# Patient Record
Sex: Female | Born: 2000 | Race: Black or African American | Hispanic: No | Marital: Single | Smoking: Never smoker
Health system: Southern US, Community
[De-identification: ages and names within clinical notes are randomized; demographics above are authoritative.]

## PROBLEM LIST (undated history)

## (undated) DIAGNOSIS — K0889 Other specified disorders of teeth and supporting structures: Secondary | ICD-10-CM

## (undated) DIAGNOSIS — Z9641 Presence of insulin pump (external) (internal): Secondary | ICD-10-CM

## (undated) DIAGNOSIS — E109 Type 1 diabetes mellitus without complications: Secondary | ICD-10-CM

## (undated) DIAGNOSIS — R569 Unspecified convulsions: Secondary | ICD-10-CM

## (undated) DIAGNOSIS — E301 Precocious puberty: Secondary | ICD-10-CM

---
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
## 2022-02-09 ENCOUNTER — Ambulatory Visit (INDEPENDENT_AMBULATORY_CARE_PROVIDER_SITE_OTHER): Payer: Medicaid Other | Admitting: Family

## 2022-02-09 ENCOUNTER — Ambulatory Visit (INDEPENDENT_AMBULATORY_CARE_PROVIDER_SITE_OTHER): Payer: Medicaid Other | Admitting: Pharmacist

## 2022-02-09 ENCOUNTER — Encounter (INDEPENDENT_AMBULATORY_CARE_PROVIDER_SITE_OTHER): Payer: Self-pay | Admitting: Family

## 2022-02-09 ENCOUNTER — Encounter (INDEPENDENT_AMBULATORY_CARE_PROVIDER_SITE_OTHER): Payer: Self-pay | Admitting: Pharmacist

## 2022-02-09 VITALS — BP 108/62 | HR 93 | Wt 123.0 lb

## 2022-02-09 DIAGNOSIS — Z91199 Patient's noncompliance with other medical treatment and regimen due to unspecified reason: Secondary | ICD-10-CM | POA: Diagnosis not present

## 2022-02-09 DIAGNOSIS — Z4681 Encounter for fitting and adjustment of insulin pump: Secondary | ICD-10-CM

## 2022-02-09 DIAGNOSIS — F418 Other specified anxiety disorders: Secondary | ICD-10-CM | POA: Diagnosis not present

## 2022-02-09 DIAGNOSIS — E785 Hyperlipidemia, unspecified: Secondary | ICD-10-CM

## 2022-02-09 DIAGNOSIS — E1065 Type 1 diabetes mellitus with hyperglycemia: Secondary | ICD-10-CM

## 2022-02-09 DIAGNOSIS — E782 Mixed hyperlipidemia: Secondary | ICD-10-CM

## 2022-02-09 LAB — POCT GLYCOSYLATED HEMOGLOBIN (HGB A1C): Hemoglobin A1C: 10.9 % — AB (ref 4.0–5.6)

## 2022-02-09 LAB — POCT GLUCOSE (DEVICE FOR HOME USE): Glucose Fasting, POC: 271 mg/dL — AB (ref 70–99)

## 2022-02-09 MED ORDER — ACCU-CHEK GUIDE W/DEVICE KIT: PACK | 6 refills | Status: DC

## 2022-02-09 MED ORDER — OMNIPOD 5 DEXG7G6 INTRO GEN 5 KIT: 1.0000 | PACK | 2 refills | Status: AC

## 2022-02-09 MED ORDER — ACCU-CHEK SOFTCLIX LANCETS MISC: 11 refills | Status: DC

## 2022-02-09 MED ORDER — ACCU-CHEK GUIDE VI STRP: ORAL_STRIP | 11 refills | Status: DC

## 2022-02-09 NOTE — Progress Notes (Signed)
Subjective:  Chief Complaint  Patient presents with   Diabetes    Omnipod 5 Pump Training     Endocrinology provider: Hermenia Bers, NP (joint appt)  Patient referred to me by Hermenia Bers, NP for Omnipod 5 pump training. PMH significant for T1DM, chronic migraine, dysmenorrhea, binocular vision disorder. Patient is currently using Dexcom G6 CGM and Omnipod Eros insulin pump.  Patient presents today independently. Patient brings Omnipod 5 G6 pods (1 pack of 5 pods) previously filled 12/14/2020. She does not have Omnipod 5 Intro Kit, specifically controller. Verlean will no longer have Medicaid for 2024 and will be transitioning to her mother insurance.   Insurance: [place] Managed [facility])  [id number] [place] [facility] [address] [place]Phone: [phone] Fax: [phone] #: [id number] DAW Reason: --   Omnipod Original Pump Settings  Basal (Max: 1.5 units/hr) 12AM 0.65  6AM 0.95  12PM 0.90  8PM 1.05            Total: 21 units  Insulin to carbohydrate ratio (ICR)  12AM 10  6AM 6 --> 8                  Max Bolus: 15 units  Insulin Sensitivity Factor (ISF) 12AM 50  6AM 40  9PM 45                Target BG 12AM 150  6AM 110  9PM 150                 Omnipod Education Training Please refer to ODalzellscanned into media  Glooko Account: [email or web address]  Podder Account:  --[email or web address]    Objective:   There were no vitals filed for this visit.  HbA1c Lab Results  Component Value Date   HGBA1C 10.9 (A) 02/09/2022   HGBA1C 10.4 (A) 09/29/2021   HGBA1C 11.5 (H) 07/19/2021    Pancreatic Islet Cell Autoantibodies Lab Results  Component Value Date   ISLETAB 40 (A) 12/08/2008    Insulin Autoantibodies Lab Results  Component Value Date   INSULINAB 9.1 (H) 12/08/2008     Glutamic Acid Decarboxylase Autoantibodies Lab Results  Component Value Date   GLUTAMICACAB 8.3 (H) 12/08/2008    ZnT8 Autoantibodies No results found for: "ZNT8AB"  IA-2 Autoantibodies No results found for: "LABIA2"  C-Peptide Lab Results  Component Value Date   CPEPTIDE 0.54 (L) 12/08/2008    Microalbumin Lab Results  Component Value Date   MICRALBCREAT NOTE 07/19/2021    Lipids    Component Value Date/Time   CHOL 232 (H) 07/19/2021 1351   TRIG 82 07/19/2021 1351   HDL 66 07/19/2021 1351   CHOLHDL 3.5 07/19/2021 1351   VLDL 20 01/28/2014 1100   LDLCALC 147 (H) 07/19/2021 1351    Assessment and Plan: Pump Settings - per SHermenia Bers NP (appreciate assistance and expertise). I will assist patient with entering settings into Omnipod 5 controller on 02/14/22 4:00 pm.  Pump Education - Parents appeared to have sufficient understanding of subjects discussed during OTurrellTraining appt.  Pharmacy Issues - Patient brings Omnipod 5 G6 pods (1 pack of 5 pods) previously filled 12/14/2020. She does not have Omnipod 5 Intro Kit, specifically controller. [facility] [address] [place] [facility] that this prescription was filled for G6 pods rather than intro kit (misfill) but unfortunately cannot  reverse insurance claim considering prescription was filled >1 year ago. Provided verbal prescription for intro kit and asked her if insurance would be able to be processed to determine if covered. Pharmacist confirmed $0 copay, however, intro kit is not in stock. She will order the intro kit and should be in stock tomorrow. I also sent in prescription for Accu Chek meter and supplies as new Omnipod 5 controller does not have capability to check blood glucose.  Insurance Issues - Kahmya will no longer have Medicaid for 2024 and will be transitioning to her mother's insurance. Mother is obtaining new insurance for 2024 and does not currently have  the card. She would like to know the copay of Omnipod 5 moving forward to determine if upgrading to the Omnipod 5 will be feasible from a financial standpoint. Advised [place] to send me a picture of pharmacy insurance card (should contain RxBIN, RxPCN, RxGroup, ID number) as soon as it arrives and I am happy to route card to our certified pharmacy technician team to determine copay.   Emailed Omnipod 5 Resource guide to [email or web address]  This appointment required 120 minutes of patient care (this includes precharting, chart review, review of results, face-to-face care, etc.).  Thank you for involving clinical pharmacist/diabetes educator to assist in providing this patient's care.  Drexel Iha, PharmD, BCACP, [place], CPP

## 2022-02-09 NOTE — Progress Notes (Signed)
Pediatric Endocrinology Diabetes Consultation Follow-up Visit  STEPHAN DRAUGHN 03-28-2000 [id number]  Chief Complaint: Follow-up type 1 diabetes   Pcp, No   HPI: Phyllis [name]  is a 21 y.o. female presenting for follow-up of type 1 diabetes. she is accompanied to this visit by her mother.  1. Eula was diagnosed with new-onset type 1 diabetes mellitus on 12/08/2008.  She was started on our usual multiple daily injection of insulin regimen with Lantus as a basal insulin and Novolog as her rapid-acting insulin. She was later converted to a Medtronic Revel insulin pump. They have also had ongoing concerns about early puberty. 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 However, since spring of 2013 mom has felt that she is progressing faster into puberty. She had a Supprelin implant placed on 03/01/12. However, she never had good suppression with the implant and it was removed 08/16/2012   2. Since her last appointment on 09/2021 she has been well.   She will changing insurance to [employer] and dropping medicaid due to her age. Concerned about price of Omnipod 5 supplies. Has back up supply of Omnipod Eros so will use those until she is out.   She went about 1 month between October and November not wearing CGM due to issues with insurance.  Did not do fingerstick blood sugars when she was out of She reports "doing better" with bolusing other then in October. She states she is trying to be more consistent with diabetes care. Reports hypoglycemia is occurring when she use square wave boluses. High blood sugars when she forgets to bolus.    Insulin regimen:   Basal Rates 12AM 0,65  6am 0.95   12pm  0.90   8pm 1.05      21 units per day   Insulin to Carbohydrate Ratio 12AM 10  6am 6             Insulin Sensitivity Factor 12AM 50   6am 40  9pm 45         Target Blood Glucose 12AM 150  6am 110  9pm 150          Hypoglycemia: She is sable to feel low  blood sugars.  No glucagon needed recently.  Insulin Pump download:     - CGM and Omnipod show patterns of low blood sugars following boluses at meals.    Med-alert ID: Not currently wearing. Injection sites: arms and legs  Annual labs due: 06/2022 Ophthalmology due: Overdue. Stressed importance     3. ROS: Greater than 10 systems reviewed with pertinent positives listed in HPI, otherwise neg. Review of Systems  Constitutional: Negative for malaise/fatigue.  HENT: Negative.   Eyes: Negative for blurred vision, photophobia and pain.  Respiratory: Negative for cough and shortness of breath.   Cardiovascular: Negative for chest pain and palpitations.  Gastrointestinal: Negative for abdominal pain, constipation and diarrhea.  Genitourinary: Negative for frequency and urgency.  Musculoskeletal: Negative for neck pain.  Skin: Negative for itching and rash.  Neurological: Negative for dizziness, tingling, tremors, sensory change, seizures, weakness and headaches.  Endo/Heme/Allergies: Negative for polydipsia.  Psychiatric/Behavioral: Denies depression and anxiety  Denies SI.            Past Medical History:   Past Medical History:  Diagnosis Date   Diabetes mellitus type 1 (Jack)    Insulin pump; history of DKA 01/19/2012   Insulin pump in place    Precocious puberty 12/2011   Seizures [facility]) age  8   x 1 - due to low blood sugar   Tooth loose 02/27/2012   x 1 - upper    Medications:  Outpatient Encounter Medications as of 02/09/2022  Medication Sig   Continuous Blood Gluc Sensor (DEXCOM G6 SENSOR) MISC 1 Units by Does not apply route as needed.   Continuous Blood Gluc Transmit (DEXCOM G6 TRANSMITTER) MISC 1 Units by Does not apply route as needed.   insulin lispro (HUMALOG) 100 UNIT/ML injection INJECT 300 UNITS WITH INSULIN PUMP EVERY 48 HOURS.   calcium carbonate (TUMS - DOSED IN MG ELEMENTAL CALCIUM) 500 MG chewable tablet Chew 1 tablet (200 mg of elemental calcium total)  by mouth 2 (two) times daily as needed for indigestion or heartburn. (Patient not taking: Reported on 12/10/2020)   famotidine (PEPCID) 20 MG tablet TAKE 1 TABLET (20 MG TOTAL) BY MOUTH DAILY. (Patient not taking: Reported on 12/10/2020)   Glucagon (BAQSIMI TWO PACK) 3 MG/DOSE POWD Place 1 Units into the nose as needed. (Patient not taking: Reported on 12/10/2020)   glucagon (GLUCAGON EMERGENCY) 1 MG injection Inject 1 mg IM for severe hypoglycemia. (Patient not taking: Reported on 03/24/2020)   ibuprofen (ADVIL,MOTRIN) 200 MG tablet Take 400 mg by mouth every 6 (six) hours as needed for moderate pain. (Patient not taking: Reported on 12/10/2020)   Insulin Disposable Pump (OMNIPOD 5 G6 INTRO, GEN 5,) KIT Inject 1 Device into the skin as directed. Change pod every 2 days. This will be a 30 day supply. Please fill for [facility] [id number] (Patient not taking: Reported on 02/11/2021)   Lancets (ACCU-CHEK MULTICLIX) lancets USE AS DIRECTED TO TEST BLOOD GLUCOSE 6 TIMES DAILY (Patient not taking: Reported on 07/19/2021)   lidocaine-prilocaine (EMLA) cream APPLY TO PUMP SITE CHANGES (Patient not taking: Reported on 09/29/2021)   NOVOLOG FLEXPEN 100 UNIT/ML FlexPen Inject up to 50 units of Novolog aspart insulin as needed. (Patient taking differently: Inject 50 Units into the skin as needed for high blood sugar (for emergencies only).Marilu Favre 150-35 MCG/24HR transdermal patch Place onto the skin See admin instructions. wear one patch every 7 days for 3 weeks, and then the fourth week is patch-free. (Patient not taking: Reported on 05/25/2020)   No facility-administered encounter medications on file as of 02/09/2022.    Allergies: No Known Allergies  Surgical History: Past Surgical History:  Procedure Laterality Date   SUPPRELIN IMPLANT  03/01/2012   Procedure: SUPPRELIN IMPLANT;  Surgeon: Jerilynn Mages. Gerald Stabs, MD;  Location: [place];  Service: Pediatrics;  Laterality: Right;  RIGHT ARM     SUPPRELIN REMOVAL Right 08/16/2012   Procedure: SUPPRELIN REMOVAL;  Surgeon: Jerilynn Mages. Gerald Stabs, MD;  Location: [place];  Service: Pediatrics;  Laterality: Right;    Family History:  Family History  Problem Relation Age of Onset   Hypertension Mother    Hypertension Maternal Grandfather    Migraines Maternal Grandmother    Depression Maternal Grandmother    Anxiety disorder Maternal Grandmother    Depression Maternal Aunt       Social History: Lives with: Mother and sister  Working at [employer]   Physical Exam:  Vitals:   02/09/22 0837  BP: 108/62  Pulse: 93  Weight: 123 lb (55.8 kg)       BP 108/62   Pulse 93   Wt 123 lb (55.8 kg)   BMI 23.40 kg/m  Body mass index: body mass index is 23.4 kg/m. Blood pressure %iles are not available for  patients who are 18 years or older.  Ht Readings from Last 3 Encounters:  12/10/20 5' 0.79" (1.544 m) (9 %, Z= -1.36)*  05/25/20 5' 0.63" (1.54 m) (8 %, Z= -1.41)*  03/23/20 5' 1.3" (1.557 m) (13 %, Z= -1.14)*   * Growth percentiles are based on CDC (Girls, 2-20 Years) data.   Wt Readings from Last 3 Encounters:  02/09/22 123 lb (55.8 kg) (40 %, Z= -0.24)*  09/29/21 118 lb 3.2 oz (53.6 kg) (32 %, Z= -0.47)*  07/19/21 123 lb 12.8 oz (56.2 kg) (44 %, Z= -0.14)*   * Growth percentiles are based on CDC (Girls, 2-20 Years) data.   PHYSICAL EXAM:  General: Well developed, well nourished female in no acute distress.   Head: Normocephalic, atraumatic.   Eyes:  Pupils equal and round. EOMI.   Sclera white.  No eye drainage.   Ears/Nose/Mouth/Throat: Nares patent, no nasal drainage.  Normal dentition, mucous membranes moist.   Neck: supple, no cervical lymphadenopathy, no thyromegaly Cardiovascular: regular rate, normal S1/S2, no murmurs Respiratory: No increased work of breathing.  Lungs clear to auscultation bilaterally.  No wheezes. Abdomen: soft, nontender, nondistended. No appreciable masses  Extremities:  warm, well perfused, cap refill < 2 sec.   Musculoskeletal: Normal muscle mass.  Normal strength Skin: warm, dry.  No rash or lesions. Neurologic: alert and oriented, normal speech, no tremor   Labs:   Results for orders placed or performed in visit on 02/09/22  POCT glycosylated hemoglobin (Hb A1C)  Result Value Ref Range   Hemoglobin A1C 10.9 (A) 4.0 - 5.6 %   HbA1c POC (<> result, manual entry)     HbA1c, POC (prediabetic range)     HbA1c, POC (controlled diabetic range)    POCT Glucose (Device for Home Use)  Result Value Ref Range   Glucose Fasting, POC 271 (A) 70 - 99 mg/dL   POC Glucose    '   Assessment/Plan: Samanthamarie is a 21 y.o. female with uncontrolled type 1 diabetes on Omnipod insulin pump. Keyarah has a pattern of hypoglycemia post prandially, need to reduce carb ratio. Hemoglobin A1c 10.9% which is higher then ADA goal of <7%. She would benefit from changing to Omnipod 5 closed loop system as we have discussed at multiple appointments.     1. DM w/o complication type I, uncontrolled (HCC)/ Hyperglycemia - Reviewed insulin pump and CGM download. Discussed trends and patterns.  - Rotate pump sites to prevent scar tissue.  - bolus 15 minutes prior to eating to limit blood sugar spikes.  - Reviewed carb counting and importance of accurate carb counting.  - Discussed signs and symptoms of hypoglycemia. Always have glucose available.  - POCT glucose and hemoglobin A1c  - Reviewed growth chart.  - Discussed transition to Omnipod 5.  - Stressed importance of closely monitoring blood glucose levels even when not wearing Dexcom CGM.   2. Anxiety/depression 3. Noncompliance  - Stressed importance of good diabetes care to prevent diabetes related complicated.  - Discussed barriers to care.   3. Insulin pump titration  Insulin to Carbohydrate Ratio 12AM 10  6am 6--> 8              4. Hyperlipidemia.  - Encouraged improving diabetes care along with healthy diet.     Follow-up: 3 months.   LOS: >40  spent today reviewing the medical chart, counseling the patient/family, and documenting today's visit. When a patient is on insulin, intensive monitoring of blood glucose levels is  necessary to avoid hyperglycemia and hypoglycemia. Severe hyperglycemia/hypoglycemia can lead to hospital admissions and be life threatening.     Hermenia Bers,  FNP-C  Pediatric Specialist  [address] [place]  [place], [address]  Tele: [phone]

## 2022-02-09 NOTE — Patient Instructions (Addendum)
It was a pleasure seeing you in clinic today. Please do not hesitate to contact me if you have questions or concerns.   Please sign up for MyChart. This is a communication tool that allows you to send an email directly to me. This can be used for questions, prescriptions and blood sugar reports. We will also release labs to you with instructions on MyChart. Please do not use MyChart if you need immediate or emergency assistance. Ask our wonderful front office staff if you need assistance.    Insulin to Carbohydrate Ratio 12AM 10  6am 6--> 8

## 2022-02-09 NOTE — Patient Instructions (Signed)
It was a pleasure seeing you today!  If your pump breaks, your Lantus/Basaglar/Semglee dose would be 21 units daily. You would do the following equation for your Novolog or Humalog:  Novolog or Humalog total dose = food dose + correction dose Food dose: total carbohydrates divided by insulin carbohydrate ratio (ICR) Insulin carbohydrate ratio (ICR) Breakfast: 8 Lunch: 8 Dinner: 8 Bedtime: 10 Correction dose: (current blood sugar - target blood sugar) divided by insulin sensitivity factor (ISF, also known as correction factor)  Insulin sensitivity factor (ISF) Breakfast: 40 Lunch: 40 Dinner: 40 Bedtime: 50 Target blood sugar Breakfast: 110 Lunch: 110 Dinner: 110 Bedtime: 150  PLEASE REMEMBER TO CONTACT OFFICE IF YOU ARE AT RISK OF RUNNING OUT OF PUMP SUPPLIES, INSULIN PEN SUPPLIES, OR IF YOU WANT TO KNOW WHAT YOUR BACK UP INSULIN PEN DOSES ARE.   To summarize our visit, these are the major updates with Omnipod 5:  Automated vs limited vs manual mode Automated mode: this is when the "smart" pump is turned on and pump will adjust insulin based on Dexcom readings predicted 60 minutes into the future Limited mode: when pump is trying to connect to automated mode, however, there may be issues. For example, when new Dexcom sensor is applied there is a 2 hour warm up period (no CGM readings). Manual mode: this is when the "smart" pump is NOT turned on and pump goes back to settings put in by provider (kind of like going back to [employer]) You can switch modes by going to settings --> mode --> switch from automated to manual mode or vice versa Why would I switch from automated mode to manual mode? 1. To put in new Dexcom transmitter code (reminder you must do this every 90 days AFTER you update it in Dexcom app) To do this you will change to manual mode --> settings --> CGM transmitter --> enter new code 2. If you get put on steroid medications (e.g., prednisone, methylprednisolone) 3.  If you try activity mode and still experience low blood sugars then you can go to manual mode to turn on a temporary basal rate (decrease 100% in 30 min incrememnts) KEEP IN MIND LINE OF SIGHT WITH DEXCOM! Dexcom and pod must be on the same side of the body. They can be across from each other on the abdomen or lower back/upper buttocks (refer to pages 20 and 21 in resource guide) Make sure to press use CGM rather than type in blood sugar when blousing. When you press use CGM it takes in consideration the Dexcom reading AND arrow.  Omnipod 5 pods will have a clear tab and have Omnipod 5 written on pod compared to Dash pods (blue tab). Omnipod Dash and Omnipod 5 pods cannot be interchangeable. You must solely use Omnipod 5 pods when using Omnipod 5 PDM/app.  If your Omnipod is having issues with receiving Dexcom readings make sure to move the PDM/cellphone closer to the POD (NOT the Dexcom) (refer to page 9 of resource guide to review system communication)  Please contact me (Dr. Ladona Ridgel) at [phone] or via Mychart with any questions/concerns

## 2022-02-10 ENCOUNTER — Other Ambulatory Visit (INDEPENDENT_AMBULATORY_CARE_PROVIDER_SITE_OTHER): Payer: Self-pay | Admitting: Family

## 2022-02-14 ENCOUNTER — Telehealth (INDEPENDENT_AMBULATORY_CARE_PROVIDER_SITE_OTHER): Payer: Self-pay | Admitting: Pharmacist

## 2022-02-14 ENCOUNTER — Encounter (INDEPENDENT_AMBULATORY_CARE_PROVIDER_SITE_OTHER): Payer: Self-pay | Admitting: Pharmacist

## 2022-02-14 DIAGNOSIS — E1065 Type 1 diabetes mellitus with hyperglycemia: Secondary | ICD-10-CM

## 2022-02-14 MED ORDER — OMNIPOD 5 DEXG7G6 PODS GEN 5 MISC: 1.0000 | 4 refills | Status: DC

## 2022-02-14 MED ORDER — OMNIPOD 5 DEXG7G6 INTRO GEN 5 KIT: 1.0000 | PACK | 2 refills | Status: DC

## 2022-02-14 NOTE — Telephone Encounter (Signed)
Called patient on 02/14/2022 at 4:46 PM   Explained to her I had called her pharmacy - apparently Omnnipod 5 intro kit is still being processed in order (has not came yet). I have contacted my Omnipod rep, Gabe, who confirmed it is not on backorder.  Will try sending prescription to [facility] as they have a partnership with Omnipod to see if they can fill prescription. Will send [facility] contact info to [place] via Mychart and advise them to follow up with [facility] pharmacy tomorrow then contact me with response to further assist and/or reschedule training appt.  Thank you for involving clinical pharmacist/diabetes educator to assist in providing this patient's care.   Mary Taylor, PharmD, BCACP, CDCES, CPP;e 

## 2022-02-16 ENCOUNTER — Encounter (INDEPENDENT_AMBULATORY_CARE_PROVIDER_SITE_OTHER): Payer: Self-pay | Admitting: Pharmacist

## 2022-02-16 NOTE — Telephone Encounter (Signed)
Please follow up with [facility] pharmacy to ask  If they were able to fill Phyllis [name]'s Omnipod 5 Intro kit prescription Are they having any issues ordering Omnipod 5 intro kit or refill pods  Thank you for involving clinical pharmacist/diabetes educator to assist in providing this patient's care.   Drexel Iha, PharmD, BCACP, [place], CPP

## 2022-02-16 NOTE — Telephone Encounter (Signed)
Called [facility] to follow up:  Pre the rep:  1) [facility] [address] sent to the pharmacy on 02/16/22 2) [facility] an issue but the local pharmacies as well as places like [facility] have had issues getting them in stock to fill.   Per rep they follow up with the pharmacies 1 day after sending the script.  They speak with [facility] and [facility] almost daily and if they don't have it in stock they can typically get it in within a day or 2.

## 2022-03-02 ENCOUNTER — Telehealth (INDEPENDENT_AMBULATORY_CARE_PROVIDER_SITE_OTHER): Payer: Self-pay | Admitting: Pharmacist

## 2022-03-03 ENCOUNTER — Telehealth (INDEPENDENT_AMBULATORY_CARE_PROVIDER_SITE_OTHER): Payer: Medicaid Other | Admitting: Pharmacist

## 2022-03-03 ENCOUNTER — Encounter (INDEPENDENT_AMBULATORY_CARE_PROVIDER_SITE_OTHER): Payer: Self-pay | Admitting: Pharmacist

## 2022-03-03 DIAGNOSIS — E1065 Type 1 diabetes mellitus with hyperglycemia: Secondary | ICD-10-CM | POA: Diagnosis not present

## 2022-03-03 DIAGNOSIS — Z9641 Presence of insulin pump (external) (internal): Secondary | ICD-10-CM | POA: Diagnosis not present

## 2022-03-03 NOTE — Progress Notes (Signed)
This is a Pediatric Specialist E-Visit (My Chart Video Visit) follow up consult provided via [place] consented to an E-Visit consult today.  Location of patient: Phyllis [name] is at home  Location of provider: Drexel Iha, PharmD, BCACP, CDCES, CPP is at office.    S:     Chief Complaint  Patient presents with   Diabetes    Pump Settings    Endocrinology provider: Hermenia Bers, NP (no upcoming appt)  Patient referred to me by Hermenia Bers, NP for Omnipod 5 pump training. PMH significant for T1DM, chronic migraine, dysmenorrhea, binocular vision disorder. Patient is currently using Dexcom G6 CGM and Omnipod Eros insulin pump.  I connected with Phyllis [name] on 03/03/2022 by video and verified that I am speaking with the correct person using two identifiers. She is still awaiting new insurance card to determine Dexcom and Omnipod 5 pump coverage. She does not have any Dexcom G6 sensors and is awaiting insurance coverage before getting more. She wants to wait to start Omnipod 5 until she can determine coverage and attain additional Dexcom G6 sensors.   Insurance: [place] Managed [facility])   [id number] [place] [place] [place] [address], [place] [address] Phone: [phone]  Fax: [phone] DEA #: [id number]  DAW Reason: --    Omnipod Original Pump Settings   Basal (Max: 1.5 units/hr) 12AM 0.65  6AM 0.95  12PM 0.90  8PM 1.05            Total: 21 units   Insulin to carbohydrate ratio (ICR)  12AM 10  6AM 6 --> 8                      Max Bolus: 15 units   Insulin Sensitivity Factor (ISF) 12AM 50  6AM 40  9PM 45                   Target BG 12AM 150  6AM 110  9PM 150                     O:   Labs:   There were no vitals filed for this visit.  HbA1c Lab Results  Component Value Date   HGBA1C 10.9 (A) 02/09/2022   HGBA1C 10.4 (A) 09/29/2021    HGBA1C 11.5 (H) 07/19/2021    Pancreatic Islet Cell Autoantibodies Lab Results  Component Value Date   ISLETAB 40 (A) 12/08/2008    Insulin Autoantibodies Lab Results  Component Value Date   INSULINAB 9.1 (H) 12/08/2008    Glutamic Acid Decarboxylase Autoantibodies Lab Results  Component Value Date   GLUTAMICACAB 8.3 (H) 12/08/2008    ZnT8 Autoantibodies No results found for: "ZNT8AB"  IA-2 Autoantibodies No results found for: "LABIA2"  C-Peptide Lab Results  Component Value Date   CPEPTIDE 0.54 (L) 12/08/2008    Microalbumin Lab Results  Component Value Date   MICRALBCREAT NOTE 07/19/2021    Lipids    Component Value Date/Time   CHOL 232 (H) 07/19/2021 1351   TRIG 82 07/19/2021 1351   HDL 66 07/19/2021 1351   CHOLHDL 3.5 07/19/2021 1351   VLDL 20 01/28/2014 1100   LDLCALC 147 (H) 07/19/2021 1351    Assessment:  Provided pump settings. Patient's pump had to upgrade while on the phone. Patient will await new insurance card information for pharmacy benefits and will send this information as  soon as possible to assess cost of Omnipod 5 and Dexcom G6 CGM. Belem Hintze she could tell her mother to contact her HR department to follow up regarding pharmacy benefits information.  Omnipod 5 Pump Settings   Basal (Max: 1.5 units/hr) 12AM 0.65  6AM 0.95  12PM 0.90  8PM 1.05            Total: 21 units   Insulin to carbohydrate ratio (ICR)  12AM 10  6AM 8                      Max Bolus: 15 units   Insulin Sensitivity Factor (ISF) 12AM 50  6AM 40  9PM 45                   Target and Correct Above BG 12AM 150 / 150  9AM 130 / 130                      Active Insulin Time: 3 hours  Reverse Correction: OFF  This appointment required 25 minutes of patient care (this includes precharting, chart review, review of results, virtual care, etc.).  Thank you for involving clinical pharmacist/diabetes educator to assist in providing this  patient's care.  Drexel Iha, PharmD, BCACP, [place], CPP

## 2022-03-07 ENCOUNTER — Other Ambulatory Visit (INDEPENDENT_AMBULATORY_CARE_PROVIDER_SITE_OTHER): Payer: Self-pay | Admitting: Pharmacist

## 2022-03-07 DIAGNOSIS — E1065 Type 1 diabetes mellitus with hyperglycemia: Secondary | ICD-10-CM

## 2022-03-07 MED ORDER — DEXCOM G6 SENSOR MISC: 1.0000 | 5 refills | Status: DC

## 2022-03-07 MED ORDER — DEXCOM G6 TRANSMITTER MISC: 1.0000 | 3 refills | Status: DC

## 2022-03-07 MED ORDER — OMNIPOD 5 DEXG7G6 PODS GEN 5 MISC: 1.0000 | 4 refills | Status: DC

## 2022-03-24 ENCOUNTER — Encounter (INDEPENDENT_AMBULATORY_CARE_PROVIDER_SITE_OTHER): Payer: Self-pay | Admitting: Family

## 2022-03-24 ENCOUNTER — Telehealth (INDEPENDENT_AMBULATORY_CARE_PROVIDER_SITE_OTHER): Payer: Medicaid Other | Admitting: Pharmacist

## 2022-03-24 ENCOUNTER — Telehealth (INDEPENDENT_AMBULATORY_CARE_PROVIDER_SITE_OTHER): Payer: Self-pay | Admitting: Pharmacist

## 2022-03-24 ENCOUNTER — Other Ambulatory Visit (INDEPENDENT_AMBULATORY_CARE_PROVIDER_SITE_OTHER): Payer: Self-pay | Admitting: Family

## 2022-03-24 ENCOUNTER — Encounter (INDEPENDENT_AMBULATORY_CARE_PROVIDER_SITE_OTHER): Payer: Self-pay | Admitting: Pharmacist

## 2022-03-24 DIAGNOSIS — Z9641 Presence of insulin pump (external) (internal): Secondary | ICD-10-CM

## 2022-03-24 DIAGNOSIS — E1065 Type 1 diabetes mellitus with hyperglycemia: Secondary | ICD-10-CM

## 2022-03-24 MED ORDER — LANTUS SOLOSTAR 100 UNIT/ML ~~LOC~~ SOPN: PEN_INJECTOR | SUBCUTANEOUS | 4 refills | Status: DC

## 2022-03-24 MED ORDER — BD PEN NEEDLE NANO U/F 32G X 4 MM MISC: 4 refills | Status: DC

## 2022-03-24 NOTE — Telephone Encounter (Signed)
  Name of who is calling: Phyllis [name]  Best contact number: [phone]  Provider they see: Drexel Iha   Reason for call: Patient is calling stating her omnipod dash shut down and she called support and they said in the system they have her in with the omnipod 5 so she can't use the dash any more. She dont have any needles either to give herself insulin so needs to set the omnipod 5 up.

## 2022-03-24 NOTE — Progress Notes (Signed)
This is a Pediatric Specialist E-Visit (My Chart Video Visit) follow up consult provided via [place] consented to an E-Visit consult today.  Location of patient: Phyllis [name] is at home  Location of provider: Drexel Iha, PharmD, BCACP, CDCES, CPP is at office.    S:     Chief Complaint  Patient presents with   Diabetes    Endocrinology provider: Hermenia Bers, NP (no upcoming appt)  Patient referred to me by Hermenia Bers, NP for Omnipod 5 pump training. PMH significant for T1DM, chronic migraine, dysmenorrhea, binocular vision disorder. Patient is currently using Dexcom G6 CGM and Omnipod Eros insulin pump.  I connected with Phyllis [name] on 03/24/2022 by video and verified that I am speaking with the correct person using two identifiers. Phyllis [name] panicked as today her Omnipod Eros pump malfunctioned. She showed it to me via the video appointment and the screen was changing colors rapidly. She has her Omnipod 5 controller and Omnipod 5 pods. She was scared to start the Omnipod 5 without speaking with me as she wanted a refresher from our prior training appointment before starting. She started the Omnipod 5 pod, but wanted to understand how to bolus as well as a refresher on automated mode. She also connected her Dexcom G6 transmitter to the Omnipod 5 controller. She thinks right now she still has Medicaid. She does not have back up insulin pens.  Insurance: [place] [employer] [place] [facility])   Pharmacy  [id number] - [place], [place] AT [place] [address] [place] [address] Phone: [phone]  Fax: [phone] DEA #: [id number]  DAW Reason: --    Omnipod 5 Pump Settings   Basal (Max: 1.5 units/hr) 12AM 0.65  6AM 0.95  12PM 0.90  8PM 1.05            Total: 21 units   Insulin to carbohydrate ratio (ICR)  12AM 10  6AM 8                      Max Bolus: 15 units   Insulin Sensitivity Factor  (ISF) 12AM 50  6AM 40  9PM 45                   Target and Correct Above BG 12AM 150 / 150  9AM 130 / 130                      Active Insulin Time: 3 hours  Reverse Correction: OFF   O:   Labs:   There were no vitals filed for this visit.  HbA1c Lab Results  Component Value Date   HGBA1C 10.9 (A) 02/09/2022   HGBA1C 10.4 (A) 09/29/2021   HGBA1C 11.5 (H) 07/19/2021    Pancreatic Islet Cell Autoantibodies Lab Results  Component Value Date   ISLETAB 40 (A) 12/08/2008    Insulin Autoantibodies Lab Results  Component Value Date   INSULINAB 9.1 (H) 12/08/2008    Glutamic Acid Decarboxylase Autoantibodies Lab Results  Component Value Date   GLUTAMICACAB 8.3 (H) 12/08/2008    ZnT8 Autoantibodies No results found for: "ZNT8AB"  IA-2 Autoantibodies No results found for: "LABIA2"  C-Peptide Lab Results  Component Value Date   CPEPTIDE 0.54 (L) 12/08/2008    Microalbumin Lab Results  Component Value Date   MICRALBCREAT NOTE 07/19/2021    Lipids    Component Value Date/Time   CHOL 232 (H)  07/19/2021 1351   TRIG 82 07/19/2021 1351   HDL 66 07/19/2021 1351   CHOLHDL 3.5 07/19/2021 1351   VLDL 20 01/28/2014 1100   LDLCALC 147 (H) 07/19/2021 1351    Assessment:  Omnipod 5 controller has previously been programmed 03/03/22. Reviewed bolusing, automated mode vs manual mode, how to connect Dexcom G6 to Omnipod controller, changing pod (on time and if it falls off early), activity mode, and pausing insulin. She was able to follow along on her controller. She verbalized understanding. She appreciated back up insulin pens have been sent in in case of pump failure. She will contact me if she has any other questions or issues arise.  This appointment required 25 minutes of patient care (this includes precharting, chart review, review of results, virtual care, etc.).  Thank you for involving clinical pharmacist/diabetes educator to assist in providing this  patient's care.  Drexel Iha, PharmD, BCACP, [place], CPP

## 2022-03-24 NOTE — Telephone Encounter (Signed)
Spoke with patient, she stated she needed to know what to do.  She does not have the correct meter with her but has the 5 information in her phone.  She called Omnipod and they couldn't help her with the Deliah Boston as they have her listed using an Omnipod 5.  She does not have back up supplies, no pens, no needles.  She does not know what her long acting is, she thinks she uses Humalog in her pump.  There is no long acting listed in her med management to refill, nor pen needles.   She does have a novolog flexpen listed.  She stated she needs to see Dr. Lovena Le immediately to get her pump on.  I told her that Dr. Lovena Le is remote except 1 day per week.  I will message her and Spenser to get refills for her pens and a back up plan until she can get her pump started.  She asked to speak with Spenser. I told her that he is seeing patients.  We will get the back up plan, supplies sent to the pharmacy, find out more information about where we are in getting her started on the 5 and give her a call back.  She stated ok.

## 2022-03-24 NOTE — Progress Notes (Signed)
DIABETES PLAN  Rapid Acting Insulin (Novolog/FiASP (Aspart) and Humalog/Lyumjev (Lispro))  **Given for Food/Carbohydrates and High Sugar/Glucose**   DAYTIME (breakfast, lunch, dinner) Target Blood Glucose 120 mg/dL Insulin Sensitivity Factor 40 Insulin to Carb Ratio  1 unit for 6  grams   Correction DOSE Food DOSE  (Glucose -Target)/Insulin Sensitivity Factor  Glucose (mg/dL) Units of Rapid Acting Insulin  Less than 120 0  121-160 1  161-200 2  201-240 3  241-280 4  281-320 5  321-360 6  361-400 7  401-440 8  441-480 9  481-520 10  521-560 11  561-600 or more 12    Number of carbohydrates divided by carb ratio  Number of Carbs Units of Rapid Acting Insulin  0-5 0  6-11 1  12-17 2  18-23 3  24-29 4  30-35 5  36-41 6  42-47 7  48-53 8  54-59 9  60-65 10  66-71 11  72-77 12  78-83 13  84-89 14  90-95 15  96-101 16  102-107 17  108-113 18  114-119 19  120-125 20  126-131 21  132-137 22  138-143 23  144-149 24  150-155 25  156-161 26  162+  (# carbs divided by 6)                  **Correction Dose + Food Dose = Number of units of rapid acting insulin **  Correction for High Sugar/Glucose Food/Carbohydrate  Measure Blood Glucose BEFORE you eat. (Fingerstick with Glucose Meter or check the reading on your Continuous Glucose Meter).  Use the table above or calculate the dose using the formula.  Add this dose to the Food/Carbohydrate dose if eating a meal.  Correction should not be given sooner than every 3 hours since the last dose of rapid acting insulin. 1. Count the number of carbohydrates you will be eating.  2. Use the table above or calculate the dose using the formula.  3. Add this dose to the Correction dose if glucose is above target.         BEDTIME Target Blood Glucose 200 mg/dL Insulin Sensitivity Factor 40  Insulin to Carb Ratio  1 unit for 10  grams   Wait at least 3 hours after taking dinner dose of insulin BEFORE checking  bedtime glucose.   Blood Sugar Less Than  100 mg/dL? Blood Sugar Between 101 - 199mg /dL? Blood Sugar Greater Than 200mg /dL?  You MUST EAT 15 carbs  1. Carb snack not needed  Carb snack not needed    2. Additional, Optional Carb Snack?  If you want more carbs, you CAN eat them now! Make sure to subtract MUST EAT carbs from total carbs then look at chart below to determine food dose. 2. Optional Carb Snack?   You CAN eat this! Make sure to add up total carbs then look at chart below to determine food dose. 2. Optional Carb Snack?   You CAN eat this! Make sure to add up total carbs then look at chart below to determine food dose.  3. Correction Dose of Insulin?  NO  3. Correction Dose of Insulin?  NO 3. Correction Dose of Insulin?  YES; please look at correction dose chart to determine correction dose.   Glucose (mg/dL) Units of Rapid Acting Insulin  Less than 200 0  201-240 1  241-280 2  281-320 3  321-360 4  361-400 5  401-440 6  441-480 7  481-520 8  521-560 9  561-600 or more 10     Number of Carbs Units of Rapid Acting Insulin  0-9 0  10-19 1  20-29 2  30-39 3  40-49 4  50-59 5  60-69 6  70-79 7  80-89 8  90-99 9  100-109 10  110-119 11  120-129 12  130-139 13  140-149 14  150-159 15  160+  (# carbs divided by 10)           Long Acting Insulin (Glargine (Basaglar/Lantus/Semglee)/Levemir/Tresiba)  **Remember long acting insulin must be given EVERY DAY, and NEVER skip this dose**                                    Give 21 units at 8 pm     If you have any questions/concerns PLEASE call [phone] to speak to the on-call  Pediatric Endocrinology provider at [facility].  Hermenia Bers, NP

## 2022-03-28 ENCOUNTER — Other Ambulatory Visit (INDEPENDENT_AMBULATORY_CARE_PROVIDER_SITE_OTHER): Payer: Self-pay | Admitting: Pharmacist

## 2022-03-28 DIAGNOSIS — E109 Type 1 diabetes mellitus without complications: Secondary | ICD-10-CM

## 2022-03-28 DIAGNOSIS — E1065 Type 1 diabetes mellitus with hyperglycemia: Secondary | ICD-10-CM

## 2022-03-28 MED ORDER — ACCU-CHEK MULTICLIX LANCETS MISC: 5 refills | Status: AC

## 2022-03-28 MED ORDER — LIDOCAINE-PRILOCAINE 2.5-2.5 % EX CREA: TOPICAL_CREAM | CUTANEOUS | 4 refills | Status: DC

## 2022-03-28 MED ORDER — DEXCOM G6 SENSOR MISC: 1.0000 | 5 refills | Status: DC

## 2022-03-28 MED ORDER — OMNIPOD 5 DEXG7G6 PODS GEN 5 MISC: 1.0000 | 4 refills | Status: AC

## 2022-03-28 MED ORDER — ACCU-CHEK GUIDE VI STRP: ORAL_STRIP | 11 refills | Status: AC

## 2022-03-28 MED ORDER — LANTUS SOLOSTAR 100 UNIT/ML ~~LOC~~ SOPN: PEN_INJECTOR | SUBCUTANEOUS | 4 refills | Status: AC

## 2022-03-28 MED ORDER — NOVOLOG FLEXPEN 100 UNIT/ML ~~LOC~~ SOPN: PEN_INJECTOR | SUBCUTANEOUS | 3 refills | Status: DC

## 2022-03-28 MED ORDER — INSULIN LISPRO 100 UNIT/ML IJ SOLN: INTRAMUSCULAR | 2 refills | Status: AC

## 2022-03-28 MED ORDER — DEXCOM G6 TRANSMITTER MISC: 1.0000 | 3 refills | Status: DC

## 2022-03-28 MED ORDER — BAQSIMI TWO PACK 3 MG/DOSE NA POWD: 1.0000 [IU] | NASAL | 5 refills | Status: AC | PRN

## 2022-03-28 MED ORDER — ACCU-CHEK SOFTCLIX LANCETS MISC: 11 refills | Status: AC

## 2022-03-28 MED ORDER — ACCU-CHEK GUIDE W/DEVICE KIT: PACK | 6 refills | Status: AC

## 2022-03-28 MED ORDER — BD PEN NEEDLE NANO U/F 32G X 4 MM MISC: 4 refills | Status: AC

## 2022-04-30 ENCOUNTER — Encounter (INDEPENDENT_AMBULATORY_CARE_PROVIDER_SITE_OTHER): Payer: Self-pay | Admitting: Pharmacist

## 2022-06-27 DIAGNOSIS — Z3490 Encounter for supervision of normal pregnancy, unspecified, unspecified trimester: Secondary | ICD-10-CM | POA: Insufficient documentation

## 2022-07-19 ENCOUNTER — Encounter (INDEPENDENT_AMBULATORY_CARE_PROVIDER_SITE_OTHER): Payer: Self-pay | Admitting: Family

## 2022-07-19 ENCOUNTER — Ambulatory Visit (INDEPENDENT_AMBULATORY_CARE_PROVIDER_SITE_OTHER): Payer: Managed Care, Other (non HMO) | Admitting: Family

## 2022-07-19 ENCOUNTER — Encounter (INDEPENDENT_AMBULATORY_CARE_PROVIDER_SITE_OTHER): Payer: Self-pay

## 2022-07-19 VITALS — BP 108/64 | HR 74 | Wt 116.6 lb

## 2022-07-19 DIAGNOSIS — Z3A09 9 weeks gestation of pregnancy: Secondary | ICD-10-CM

## 2022-07-19 DIAGNOSIS — Z9641 Presence of insulin pump (external) (internal): Secondary | ICD-10-CM | POA: Diagnosis not present

## 2022-07-19 DIAGNOSIS — E1065 Type 1 diabetes mellitus with hyperglycemia: Secondary | ICD-10-CM | POA: Diagnosis not present

## 2022-07-19 DIAGNOSIS — E785 Hyperlipidemia, unspecified: Secondary | ICD-10-CM | POA: Diagnosis not present

## 2022-07-19 DIAGNOSIS — Z91199 Patient's noncompliance with other medical treatment and regimen due to unspecified reason: Secondary | ICD-10-CM

## 2022-07-19 DIAGNOSIS — E782 Mixed hyperlipidemia: Secondary | ICD-10-CM

## 2022-07-19 LAB — POCT GLYCOSYLATED HEMOGLOBIN (HGB A1C): Hemoglobin A1C: 10.3 % — AB (ref 4.0–5.6)

## 2022-07-19 LAB — POCT GLUCOSE (DEVICE FOR HOME USE): POC Glucose: 210 mg/dl — AB (ref 70–99)

## 2022-07-19 MED ORDER — DEXCOM G6 SENSOR MISC
1.0000 | 2 refills | Status: AC
Start: 2022-07-19 — End: ?

## 2022-07-19 NOTE — Progress Notes (Signed)
Pediatric Endocrinology Diabetes Consultation Follow-up Visit  Phyllis [name] 2000-11-28 [id number]  Chief Complaint: Follow-up type 1 diabetes   Pcp, No   HPI: Phyllis [name]  is a 22 y.o. female presenting for follow-up of type 1 diabetes. she is accompanied to this visit by her mother.  1. Phyllis [name] was diagnosed with new-onset type 1 diabetes mellitus on 12/08/2008.  She was started on our usual multiple daily injection of insulin regimen with Lantus as a basal insulin and Novolog as her rapid-acting insulin. She was later converted to a Medtronic Revel insulin pump. They have also had ongoing concerns about early puberty. 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 However, since spring of 2013 mom has felt that she is progressing faster into puberty. She had a Supprelin implant placed on 03/01/12. However, she never had good suppression with the implant and it was removed 08/16/2012   2. Since her last appointment on 01/2022 she has been well.   She was seen at [facility] urgent care on 06/21/2022 with a positive urine pregnancy test. She was then seen on 06/27/2022 by OBGYN and has a follow up  appointment on Thursday 23/2024. She estimates that she is about [redacted] weeks along.   She was trained on Omnipod 5 insulin pump system on 03/24/2022 with Dr. Ladona Ridgel.  She reports that she stopped using her Omnipod insulin pump in January/February because the "device stopped working" and used MDI until she received a replacement Omnipod PDM. She began wearing her insulin pump again in April but is not wearing Dexcom CGM.  She has not worn Dexcom in a couple months and is not checking her blood sugars via fingerstick either. She has not bolused for carbs or blood sugar corrections in at least 1 month.    Insulin regimen:  Basal Rates 12AM 0,65  6am 0.95   12pm  0.90   8pm 1.05      21 units per day   Insulin to Carbohydrate Ratio 12AM 10  6am 8             Insulin Sensitivity  Factor 12AM 50   6am 40  9pm 45         Target Blood Glucose 12AM 150  6am 130  9pm 150         Back up insulin plan is Novolog 120/40/6 - Lantus 21 units   Hypoglycemia: She is sable to feel low blood sugars.  No glucagon needed recently.  Insulin Pump download:      Med-alert ID: Not currently wearing. Injection sites: arms and legs Ophthalmology due: Overdue. Stressed importance of annual eye exam.     3. ROS: Greater than 10 systems reviewed with pertinent positives listed in HPI, otherwise neg. Review of Systems  Constitutional: Negative for malaise/fatigue.  HENT: Negative.   Eyes: Negative for blurred vision, photophobia and pain.  Respiratory: Negative for cough and shortness of breath.   Cardiovascular: Negative for chest pain and palpitations.  Gastrointestinal: Negative for abdominal pain, constipation and diarrhea.  Genitourinary: Negative for frequency and urgency.  Musculoskeletal: Negative for neck pain.  Skin: Negative for itching and rash.  Neurological: Negative for dizziness, tingling, tremors, sensory change, seizures, weakness and headaches.  Endo/Heme/Allergies: Negative for polydipsia.             Past Medical History:   Past Medical History:  Diagnosis Date   Diabetes mellitus type 1 (HCC)    Insulin pump; history of DKA 01/19/2012  Insulin pump in place    Precocious puberty 12/2011   [facility] 20   x 1 - due to low blood sugar   Tooth loose 02/27/2012   x 1 - upper    Medications:  Outpatient Encounter Medications as of 07/19/2022  Medication Sig   Blood Glucose Monitoring Suppl (ACCU-CHEK GUIDE) w/Device KIT Use 1 kit as directed to monitor BG up to 6x daily   Continuous Blood Gluc Transmit (DEXCOM G6 TRANSMITTER) MISC Inject 1 Device into the skin as directed. (re-use up to 8x with each new sensor). Use to monitor glucose continuously.   Glucagon (BAQSIMI TWO PACK) 3 MG/DOSE POWD Place 1 Units into the nose as needed.    Insulin Disposable Pump (OMNIPOD 5 G6 POD, GEN 5,) MISC Inject 1 Device into the skin as directed. Change pod every 2 days. Patient will need 3 boxes (each contain 5 pods) for a 30 day supply. Please fill for [facility] [id number].   insulin glargine (LANTUS SOLOSTAR) 100 UNIT/ML Solostar Pen Inject up to 50 units as instructed by provider (21 currently)   insulin lispro (HUMALOG) 100 UNIT/ML injection INJECT 300 UNITS WITH INSULIN PUMP EVERY 48 HOURS.   Insulin Pen Needle (BD PEN NEEDLE NANO U/F) 32G X 4 MM MISC Use up to 6 x per day for injections   Lancets (ACCU-CHEK MULTICLIX) lancets USE AS DIRECTED TO TEST BLOOD GLUCOSE 6 TIMES DAILY   lidocaine-prilocaine (EMLA) cream Use as directed to apply prior to pump site changes   [DISCONTINUED] Continuous Blood Gluc Sensor (DEXCOM G6 SENSOR) MISC Inject 1 applicator into the skin as directed. (change sensor every 10 days). Use to monitor glucose continuously.   Accu-Chek Softclix Lancets lancets Use one lancet as directed to monitor BG up to 6x daily (Patient not taking: Reported on 07/19/2022)   calcium carbonate (TUMS - DOSED IN MG ELEMENTAL CALCIUM) 500 MG chewable tablet Chew 1 tablet (200 mg of elemental calcium total) by mouth 2 (two) times daily as needed for indigestion or heartburn. (Patient not taking: Reported on 12/10/2020)   Continuous Glucose Sensor (DEXCOM G6 SENSOR) MISC Inject 1 applicator into the skin as directed. (change sensor every 10 days). Use to monitor glucose continuously.   famotidine (PEPCID) 20 MG tablet TAKE 1 TABLET (20 MG TOTAL) BY MOUTH DAILY. (Patient not taking: Reported on 12/10/2020)   glucagon (GLUCAGON EMERGENCY) 1 MG injection Inject 1 mg IM for severe hypoglycemia. (Patient not taking: Reported on 03/24/2020)   glucose blood (ACCU-CHEK GUIDE) test strip Use one test strip as directed to monitor BG up to 6x daily (Patient not taking: Reported on 07/19/2022)   ibuprofen (ADVIL,MOTRIN) 200 MG tablet Take 400 mg by mouth  every 6 (six) hours as needed for moderate pain. (Patient not taking: Reported on 12/10/2020)   Insulin Disposable Pump (OMNIPOD 5 G6 INTRO, GEN 5,) KIT Inject 1 kit into the skin as directed. . Change pod every 2 days. Intro kit comes with 2 boxes of pods, PDM device, pod pals, and user manual. Please fill for Omnipod 5 Into kit NDC [phone] (Patient not taking: Reported on 03/24/2022)   NOVOLOG FLEXPEN 100 UNIT/ML FlexPen Inject up to 50 units of Novolog aspart insulin as needed. (Patient not taking: Reported on 07/19/2022)   XULANE 150-35 MCG/24HR transdermal patch Place onto the skin See admin instructions. wear one patch every 7 days for 3 weeks, and then the fourth week is patch-free. (Patient not taking: Reported on 05/25/2020)   No facility-administered encounter  medications on file as of 07/19/2022.    Allergies: No Known Allergies  Surgical History: Past Surgical History:  Procedure Laterality Date   SUPPRELIN IMPLANT  03/01/2012   Procedure: SUPPRELIN IMPLANT;  Surgeon: Judie Petit. Leonia Corona, MD;  Location: [facility];  Service: Pediatrics;  Laterality: Right;  RIGHT ARM    SUPPRELIN REMOVAL Right 08/16/2012   Procedure: SUPPRELIN REMOVAL;  Surgeon: Judie Petit. Leonia Corona, MD;  Location: [facility];  Service: Pediatrics;  Laterality: Right;    Family History:  Family History  Problem Relation Age of Onset   Hypertension Mother    Hypertension Maternal Grandfather    Migraines Maternal Grandmother    Depression Maternal Grandmother    Anxiety disorder Maternal Grandmother    Depression Maternal Aunt       Social History: Lives with: Mother and sister  Working at [employer]   Physical Exam:  Vitals:   07/19/22 1031  BP: 108/64  Pulse: 74  Weight: 116 lb 9.6 oz (52.9 kg)     BP 108/64   Pulse 74   Wt 116 lb 9.6 oz (52.9 kg)   BMI 22.19 kg/m  Body mass index: body mass index is 22.19 kg/m. Growth %ile SmartLinks can only be used for  patients less than 69 years old.  Ht Readings from Last 3 Encounters:  12/10/20 5' 0.79" (1.544 m) (9 %, Z= -1.36)*  05/25/20 5' 0.63" (1.54 m) (8 %, Z= -1.41)*  03/23/20 5' 1.3" (1.557 m) (13 %, Z= -1.14)*   * Growth percentiles are based on CDC (Girls, 2-20 Years) data.   Wt Readings from Last 3 Encounters:  07/19/22 116 lb 9.6 oz (52.9 kg)  02/09/22 123 lb (55.8 kg) (40 %, Z= -0.24)*  09/29/21 118 lb 3.2 oz (53.6 kg) (32 %, Z= -0.47)*   * Growth percentiles are based on CDC (Girls, 2-20 Years) data.   PHYSICAL EXAM: General: Well developed, well nourished female in no acute distress.   Head: Normocephalic, atraumatic.   Eyes:  Pupils equal and round. EOMI.   Sclera white.  No eye drainage.   Ears/Nose/Mouth/Throat: Nares patent, no nasal drainage.  Normal dentition, mucous membranes moist.   Neck: supple, no cervical lymphadenopathy, no thyromegaly Cardiovascular: regular rate, normal S1/S2, no murmurs Respiratory: No increased work of breathing.  Lungs clear to auscultation bilaterally.  No wheezes. Abdomen: soft, nontender, nondistended. No appreciable masses  Extremities: warm, well perfused, cap refill < 2 sec.   Musculoskeletal: Normal muscle mass.  Normal strength Skin: warm, dry.  No rash or lesions. Neurologic: alert and oriented, normal speech, no tremor   Labs:   Results for orders placed or performed in visit on 07/19/22  POCT Glucose (Device for Home Use)  Result Value Ref Range   Glucose Fasting, POC     POC Glucose 210 (A) 70 - 99 mg/dl  POCT glycosylated hemoglobin (Hb A1C)  Result Value Ref Range   Hemoglobin A1C 10.3 (A) 4.0 - 5.6 %   HbA1c POC (<> result, manual entry)     HbA1c, POC (prediabetic range)     HbA1c, POC (controlled diabetic range)    '   Assessment/Plan: Phyllis [name] is a 22 y.o. female with uncontrolled type 1 diabetes on Omnipod insulin pump. She is [redacted] weeks pregnant and in poor diabetes control with hemoglobin A1c of 10.3%. She has not  consistently monitored blood sugars with CGM therapy or blood glucose checks. She has not bolused for carbs or blood glucose correction  in the past 30 days and is relying on basal insulin. Needs to establish care with adult endocrinology for management of pregnancy with type 1 diabetes.    1. DM w/o complication type I, uncontrolled (HCC)/ Hyperglycemia - Reviewed insulin pump and CGM download. Discussed trends and patterns.  - Rotate pump sites to prevent scar tissue.  - bolus 15 minutes prior to eating to limit blood sugar spikes.  - Reviewed carb counting and importance of accurate carb counting.  - Discussed signs and symptoms of hypoglycemia. Always have glucose available.  - POCT glucose and hemoglobin A1c  - Reviewed growth chart.  - Refills sent for Dexcom G6 sensors.   2. Noncompliance  - Discussed concerns and barriers to care  - Discussed importance of good diabetes care to prevent diabetes related complications. Discussed possible complications of uncontrolled diabetes.   3. Insulin pump titration  - No changes to pump settings today since there is limited data with her not checking blood sugars or bolusing over the last month.   4. Hyperlipidemia.  Low cholesterol diet discussed along with improving diabetes care.   5. Pregnancy - Stressed importance of good diabetes control for pregnancy.  - I called and spoke with [facility] and they confirmed they could get Phyllis [name] in for an appointment by May 30th. Referral placed.  - Follow up with OBGYN as scheduled.   Phyllis [name]'s number is : [phone]    LOS: >40  spent today reviewing the medical chart, counseling the patient/family, and documenting today's visit.  When a patient is on insulin, intensive monitoring of blood glucose levels is necessary to avoid hyperglycemia and hypoglycemia. Severe hyperglycemia/hypoglycemia can lead to hospital admissions and be life threatening.     Gretchen Short,   FNP-C  Pediatric Specialist  [address]  [place], [address]  Tele: [phone]

## 2022-07-19 NOTE — Patient Instructions (Addendum)
-   Make sure you are controlling blood sugars closely especially now that you are pregnant  - Wear Dexcom CGM and Omnipod 5 system.  - make sure to bolus for all carb intake.  - Follow up closely with OBGYN  - We have contact [facility]. They will see you on May 30th to establish care. You must make this appointment as we cannot manage patients during pregnancy at this clinic.   Located in: [facility] Address: [address], [place], [place] [address] Hours:  Open ? Closes 12?PM ? Reopens 1?PM Phone: [phone] Suggest an edit

## 2022-07-21 ENCOUNTER — Telehealth (INDEPENDENT_AMBULATORY_CARE_PROVIDER_SITE_OTHER): Payer: Self-pay | Admitting: Pediatrics

## 2022-07-21 DIAGNOSIS — E1065 Type 1 diabetes mellitus with hyperglycemia: Secondary | ICD-10-CM

## 2022-07-21 LAB — HEPATITIS C ANTIBODY: HCV Ab: NEGATIVE

## 2022-07-21 LAB — OB RESULTS CONSOLE ANTIBODY SCREEN: Antibody Screen: NEGATIVE

## 2022-07-21 LAB — OB RESULTS CONSOLE RPR: RPR: NONREACTIVE

## 2022-07-21 LAB — OB RESULTS CONSOLE RUBELLA ANTIBODY, IGM: Rubella: IMMUNE

## 2022-07-21 LAB — OB RESULTS CONSOLE HEPATITIS B SURFACE ANTIGEN: Hepatitis B Surface Ag: NEGATIVE

## 2022-07-21 LAB — OB RESULTS CONSOLE GC/CHLAMYDIA
Chlamydia: POSITIVE
Neisseria Gonorrhea: NEGATIVE

## 2022-07-21 LAB — OB RESULTS CONSOLE ABO/RH: RH Type: POSITIVE

## 2022-07-21 LAB — OB RESULTS CONSOLE HIV ANTIBODY (ROUTINE TESTING): HIV: NONREACTIVE

## 2022-07-21 MED ORDER — DEXCOM G6 TRANSMITTER MISC
1.0000 | 3 refills | Status: AC
Start: 2022-07-21 — End: ?

## 2022-07-21 NOTE — Telephone Encounter (Signed)
  Name of who is calling:Phyllis [name]  Caller's Relationship to Patient:  Best contact number: [phone]  Provider they see: Larinda Buttery  Reason for call: Need a refill on her dexcom pharmacy sent in req via fax but hasn't been filled still and she needs it, Please follow up with pharmacy and pt.      PRESCRIPTION REFILL ONLY  Name of prescription:Continuous Blood Gluc Transmit (DEXCOM G6 TRANSMITTER) MISC   Pharmacy:  : [id number] - [place], [place] - [address] AT [facility] OF [address]

## 2022-07-26 LAB — OB RESULTS CONSOLE VARICELLA ZOSTER ANTIBODY, IGG: Varicella: IMMUNE

## 2022-07-26 NOTE — Telephone Encounter (Signed)
Patient was transferred to myself. Apologized to the patient for the delay. Checked the box where Dr. Diona Foley paperwork is put and the PA request was in there from 07/21/2022, sent at 1403. Relayed to [facility] that I will get the PA in and will call her when the PA is approved. Phyllis [name] was upset but agreed to this.

## 2022-07-26 NOTE — Telephone Encounter (Signed)
  Name of who is calling:Phyllis [name]   Caller's Relationship to Patient:self   Best contact number:[phone]   Provider they see:Dr. Larinda Buttery   Reason for call:caller stated that she is at the pharmacy needing her dexcom G6 transmitter. Caller stated she called last week needing a PA and it still had not been sent.       PRESCRIPTION REFILL ONLY  Name of prescription:Dexcom G6 transmitter   Pharmacy:[facility] [place], [place]

## 2022-07-27 NOTE — Telephone Encounter (Signed)
Called and spoke to [facility] to let he know that the [employer] was approved. Relayed that th pharmacy should have that approval electronically but that I will also fax the approval to the pharmacy. Verified pharmacy and relayed to [facility] that if she has any additional issues with the refill to call our office. Patient verbalized understanding and we ended the call.

## 2022-08-18 LAB — OB RESULTS CONSOLE GC/CHLAMYDIA: Chlamydia: NEGATIVE

## 2022-09-09 ENCOUNTER — Other Ambulatory Visit (INDEPENDENT_AMBULATORY_CARE_PROVIDER_SITE_OTHER): Payer: Self-pay | Admitting: Pediatrics

## 2022-09-09 DIAGNOSIS — E1065 Type 1 diabetes mellitus with hyperglycemia: Secondary | ICD-10-CM

## 2022-09-15 ENCOUNTER — Encounter (INDEPENDENT_AMBULATORY_CARE_PROVIDER_SITE_OTHER): Payer: Self-pay

## 2022-12-14 ENCOUNTER — Inpatient Hospital Stay (HOSPITAL_COMMUNITY)
Admission: AD | Admit: 2022-12-14 | Discharge: 2022-12-14 | Disposition: A | Discharge status: Home / Self Care | Payer: Medicaid Other | Attending: Obstetrics and Gynecology | Admitting: Obstetrics and Gynecology

## 2022-12-14 ENCOUNTER — Encounter (HOSPITAL_COMMUNITY): Payer: Self-pay

## 2022-12-14 DIAGNOSIS — Z3A30 30 weeks gestation of pregnancy: Secondary | ICD-10-CM | POA: Insufficient documentation

## 2022-12-14 DIAGNOSIS — R112 Nausea with vomiting, unspecified: Secondary | ICD-10-CM

## 2022-12-14 DIAGNOSIS — O26893 Other specified pregnancy related conditions, third trimester: Secondary | ICD-10-CM | POA: Insufficient documentation

## 2022-12-14 DIAGNOSIS — R1030 Lower abdominal pain, unspecified: Secondary | ICD-10-CM | POA: Diagnosis not present

## 2022-12-14 DIAGNOSIS — R739 Hyperglycemia, unspecified: Secondary | ICD-10-CM

## 2022-12-14 DIAGNOSIS — E1065 Type 1 diabetes mellitus with hyperglycemia: Secondary | ICD-10-CM | POA: Insufficient documentation

## 2022-12-14 DIAGNOSIS — Z9641 Presence of insulin pump (external) (internal): Secondary | ICD-10-CM | POA: Diagnosis not present

## 2022-12-14 DIAGNOSIS — Z794 Long term (current) use of insulin: Secondary | ICD-10-CM | POA: Diagnosis not present

## 2022-12-14 DIAGNOSIS — O212 Late vomiting of pregnancy: Secondary | ICD-10-CM | POA: Insufficient documentation

## 2022-12-14 DIAGNOSIS — O24013 Pre-existing diabetes mellitus, type 1, in pregnancy, third trimester: Secondary | ICD-10-CM | POA: Insufficient documentation

## 2022-12-14 LAB — COMPREHENSIVE METABOLIC PANEL
ALT: 19 U/L (ref 0–44)
AST: 19 U/L (ref 15–41)
Albumin: 2.7 g/dL — ABNORMAL LOW (ref 3.5–5.0)
Alkaline Phosphatase: 103 U/L (ref 38–126)
Anion gap: 12 (ref 5–15)
BUN: 11 mg/dL (ref 6–20)
CO2: 19 mmol/L — ABNORMAL LOW (ref 22–32)
Calcium: 8.8 mg/dL — ABNORMAL LOW (ref 8.9–10.3)
Chloride: 102 mmol/L (ref 98–111)
Creatinine, Ser: 0.65 mg/dL (ref 0.44–1.00)
GFR, Estimated: >60 mL/min (ref >60)
Glucose, Bld: 271 mg/dL — ABNORMAL HIGH (ref 70–99)
Potassium: 3.9 mmol/L (ref 3.5–5.1)
Sodium: 133 mmol/L — ABNORMAL LOW (ref 135–145)
Total Bilirubin: 0.5 mg/dL (ref 0.3–1.2)
Total Protein: 6.8 g/dL (ref 6.5–8.1)

## 2022-12-14 LAB — CBC
HCT: 32.8 % — ABNORMAL LOW (ref 36.0–46.0)
Hemoglobin: 10.8 g/dL — ABNORMAL LOW (ref 12.0–15.0)
MCH: 28.2 pg (ref 26.0–34.0)
MCHC: 32.9 g/dL (ref 30.0–36.0)
MCV: 85.6 fL (ref 80.0–100.0)
Platelets: 402 10*3/uL — ABNORMAL HIGH (ref 150–400)
RBC: 3.83 MIL/uL — ABNORMAL LOW (ref 3.87–5.11)
RDW: 12.8 % (ref 11.5–15.5)
WBC: 8.7 10*3/uL (ref 4.0–10.5)
nRBC: 0 % (ref 0.0–0.2)

## 2022-12-14 LAB — URINALYSIS, ROUTINE W REFLEX MICROSCOPIC
Bilirubin Urine: NEGATIVE
Glucose, UA: >=500 mg/dL — AB
Hgb urine dipstick: NEGATIVE
Ketones, ur: 80 mg/dL — AB
Leukocytes,Ua: NEGATIVE
Nitrite: NEGATIVE
Protein, ur: NEGATIVE mg/dL
Specific Gravity, Urine: 1.037 — ABNORMAL HIGH (ref 1.005–1.030)
pH: 5 (ref 5.0–8.0)

## 2022-12-14 LAB — GLUCOSE, CAPILLARY
Glucose-Capillary: 251 mg/dL — ABNORMAL HIGH (ref 70–99)
Glucose-Capillary: 191 mg/dL — ABNORMAL HIGH (ref 70–99)
Glucose-Capillary: 194 mg/dL — ABNORMAL HIGH (ref 70–99)
Glucose-Capillary: 168 mg/dL — ABNORMAL HIGH (ref 70–99)

## 2022-12-14 LAB — BETA-HYDROXYBUTYRIC ACID: Beta-Hydroxybutyric Acid: 1.23 mmol/L — ABNORMAL HIGH (ref 0.05–0.27)

## 2022-12-14 MED ORDER — INSULIN ASPART 100 UNIT/ML IJ SOLN
5.0000 [IU] | Freq: Once | INTRAMUSCULAR | Status: AC
  Administered 2022-12-14: 5 [IU] via SUBCUTANEOUS

## 2022-12-14 MED ORDER — ONDANSETRON 8 MG PO TBDP
8.0000 mg | ORAL_TABLET | Freq: Three times a day (TID) | ORAL | 0 refills | Status: AC | PRN
Start: 2022-12-14 — End: ?

## 2022-12-14 MED ORDER — ONDANSETRON HCL 4 MG/2ML IJ SOLN: 4.0000 mg | Freq: Once | INTRAMUSCULAR | Status: DC

## 2022-12-14 MED ORDER — LACTATED RINGERS IV BOLUS
1000.0000 mL | Freq: Once | INTRAVENOUS | Status: AC
  Administered 2022-12-14: 1000 mL via INTRAVENOUS

## 2022-12-14 MED ORDER — LACTATED RINGERS IV SOLN: INTRAVENOUS | Status: DC

## 2022-12-14 NOTE — MAU Provider Note (Signed)
History     CSN: [id number]  Arrival date and time: 12/14/22 4098   Event Date/Time   First Provider Initiated Contact with Patient 12/14/22 0830      Chief Complaint  Patient presents with   Contractions   Nausea   Emesis   HPI [name] is a 22 y.o. year old G1P0 female at [redacted]w[redacted]d weeks gestation who presents to MAU reporting lower abdominal cramping since 5:30 AM; cramping rated 3/10.  She reports at 6:00 she became nauseated and started vomiting.  She reports 3 episodes of vomiting since 6 AM.  She was diagnosed with a yeast infection on Monday, 12/12/2022, but has not picked up the medications.  She denies vaginal bleeding or loss of fluid.  She reports positive fetal movement.  Her pregnancy is high risk for type 1 diabetes.  She wears an OmniPod 5.  She is unsure of her basal rate.  She reports her last dose from the OmniPod 5 was yesterday morning.  She reports that she dosed herself 9 units of insulin yesterday.  She last ate at 10 PM on 12/13/2022.  She has not eaten today. She receives [facility]. Her partner/FOB is present and contributing to the history taking.   OB History     Gravida  1   Para      Term      Preterm      AB      Living         SAB      IAB      Ectopic      Multiple      Live Births              Past Medical History:  Diagnosis Date   Diabetes mellitus type 1 (HCC)    Insulin pump; history of DKA 01/19/2012   Insulin pump in place    Precocious puberty 12/2011   Seizures [facility]) age 22   x 1 - due to low blood sugar   Tooth loose 02/27/2012   x 1 - upper    Past Surgical History:  Procedure Laterality Date   SUPPRELIN IMPLANT  03/01/2012   Procedure: SUPPRELIN IMPLANT;  Surgeon: Judie Petit. Leonia Corona, MD;  Location: [facility];  Service: Pediatrics;  Laterality: Right;  RIGHT ARM    SUPPRELIN REMOVAL Right 08/16/2012   Procedure: SUPPRELIN REMOVAL;  Surgeon: Judie Petit. Leonia Corona,  MD;  Location:  SURGERY CENTER;  Service: Pediatrics;  Laterality: Right;    Family History  Problem Relation Age of Onset   Hypertension Mother    Hypertension Maternal Grandfather    Migraines Maternal Grandmother    Depression Maternal Grandmother    Anxiety disorder Maternal Grandmother    Depression Maternal Aunt     Social History   Tobacco Use   Smoking status: Never   Smokeless tobacco: Never   Tobacco comments:    no smokers in home  Vaping Use   Vaping status: Never Used  Substance Use Topics   Alcohol use: No   Drug use: No    Allergies: No Known Allergies  Medications Prior to Admission  Medication Sig Dispense Refill Last Dose   Accu-Chek Softclix Lancets lancets Use one lancet as directed to monitor BG up to 6x daily (Patient not taking: Reported on 07/19/2022) 200 each 11    Blood Glucose Monitoring Suppl (ACCU-CHEK GUIDE) w/Device KIT Use 1 kit as directed to monitor BG  up to 6x daily 1 kit 6    calcium carbonate (TUMS - DOSED IN MG ELEMENTAL CALCIUM) 500 MG chewable tablet Chew 1 tablet (200 mg of elemental calcium total) by mouth 2 (two) times daily as needed for indigestion or heartburn. (Patient not taking: Reported on 12/10/2020) 30 tablet 0    Continuous Glucose Sensor (DEXCOM G6 SENSOR) MISC Inject 1 applicator into the skin as directed. (change sensor every 10 days). Use to monitor glucose continuously. 3 each 2    Continuous Glucose Transmitter (DEXCOM G6 TRANSMITTER) MISC Inject 1 Device into the skin as directed. (re-use up to 8x with each new sensor). Use to monitor glucose continuously. 1 each 3    famotidine (PEPCID) 20 MG tablet TAKE 1 TABLET (20 MG TOTAL) BY MOUTH DAILY. (Patient not taking: Reported on 12/10/2020) 30 tablet 0    Glucagon (BAQSIMI TWO PACK) 3 MG/DOSE POWD Place 1 Units into the nose as needed. 1 each 5    glucagon (GLUCAGON EMERGENCY) 1 MG injection Inject 1 mg IM for severe hypoglycemia. (Patient not taking: Reported on  03/24/2020) 3 kit 4    glucose blood (ACCU-CHEK GUIDE) test strip Use one test strip as directed to monitor BG up to 6x daily (Patient not taking: Reported on 07/19/2022) 200 each 11    ibuprofen (ADVIL,MOTRIN) 200 MG tablet Take 400 mg by mouth every 6 (six) hours as needed for moderate pain. (Patient not taking: Reported on 12/10/2020)      Insulin Disposable Pump (OMNIPOD 5 G6 INTRO, GEN 5,) KIT Inject 1 kit into the skin as directed. . Change pod every 2 days. Intro kit comes with 2 boxes of pods, PDM device, pod pals, and user manual. Please fill for Omnipod 5 Into kit NDC [phone] (Patient not taking: Reported on 03/24/2022) 1 kit 2    Insulin Disposable Pump (OMNIPOD 5 G6 POD, GEN 5,) MISC Inject 1 Device into the skin as directed. Change pod every 2 days. Patient will need 3 boxes (each contain 5 pods) for a 30 day supply. Please fill for [facility] [address]-21. 15 each 4    insulin glargine (LANTUS SOLOSTAR) 100 UNIT/ML Solostar Pen Inject up to 50 units as instructed by provider (21 currently) 15 mL 4    insulin lispro (HUMALOG) 100 UNIT/ML injection INJECT 300 UNITS WITH INSULIN PUMP EVERY 48 HOURS. 150 mL 2    Insulin Pen Needle (BD PEN NEEDLE NANO U/F) 32G X 4 MM MISC Use up to 6 x per day for injections 200 each 4    Lancets (ACCU-CHEK MULTICLIX) lancets USE AS DIRECTED TO TEST BLOOD GLUCOSE 6 TIMES DAILY 200 each 5    lidocaine-prilocaine (EMLA) cream APPLY PRIOR TO PUMP SITE CHANGES AS DIRECTED 60 g 3    NOVOLOG FLEXPEN 100 UNIT/ML FlexPen Inject up to 50 units of Novolog aspart insulin as needed. (Patient not taking: Reported on 07/19/2022) 15 mL 3    XULANE 150-35 MCG/24HR transdermal patch Place onto the skin See admin instructions. wear one patch every 7 days for 3 weeks, and then the fourth week is patch-free. (Patient not taking: Reported on 05/25/2020)  1     Review of Systems  Constitutional: Negative.   HENT: Negative.    Eyes: Negative.   Respiratory: Negative.     Cardiovascular: Negative.   Gastrointestinal:  Positive for nausea and vomiting.  Endocrine: Negative.   Genitourinary:  Positive for pelvic pain and vaginal discharge (dx'd with yeast infection 10/14, but have not picked up  Rx).  Musculoskeletal: Negative.   Skin: Negative.   Allergic/Immunologic: Negative.   Neurological: Negative.   Hematological: Negative.   Psychiatric/Behavioral: Negative.     Physical Exam   Blood pressure 116/71, pulse 80, temperature 98.4 F (36.9 C), temperature source Oral, resp. rate 16, height 5' (1.524 m), weight 60.2 kg, SpO2 100%.  Physical Exam Vitals and nursing note reviewed. Exam conducted with a chaperone present.  Constitutional:      Appearance: Normal appearance. She is normal weight. She is ill-appearing (mildly).  Cardiovascular:     Rate and Rhythm: Tachycardia present.  Musculoskeletal:        General: Normal range of motion.  Skin:    General: Skin is warm and dry.  Neurological:     Mental Status: She is alert and oriented to person, place, and time.  Psychiatric:        Mood and Affect: Mood normal.        Behavior: Behavior normal.        Thought Content: Thought content normal.        Judgment: Judgment normal.    MAU Course  Procedures  MDM CCUA CBG Start IV LR 1 liter bolus LR 2nd liter @ 125 ml/hr CBC CMP Beta-hydroxybutyric acid   Report given to and care assumed by Antonieta Pert @ 1000   Results for orders placed or performed during the hospital encounter of 12/14/22 (from the past 24 hour(s))  Glucose, capillary     Status: Abnormal   Collection Time: 12/14/22  8:05 AM  Result Value Ref Range   Glucose-Capillary 251 (H) 70 - 99 mg/dL  Comprehensive metabolic panel     Status: Abnormal   Collection Time: 12/14/22  8:31 AM  Result Value Ref Range   Sodium 133 (L) 135 - 145 mmol/L   Potassium 3.9 3.5 - 5.1 mmol/L   Chloride 102 98 - 111 mmol/L   CO2 19 (L) 22 - 32 mmol/L   Glucose, Bld 271 (H) 70 - 99  mg/dL   BUN 11 6 - 20 mg/dL   Creatinine, Ser 5.78 0.44 - 1.00 mg/dL   Calcium 8.8 (L) 8.9 - 10.3 mg/dL   Total Protein 6.8 6.5 - 8.1 g/dL   Albumin 2.7 (L) 3.5 - 5.0 g/dL   AST 19 15 - 41 U/L   ALT 19 0 - 44 U/L   Alkaline Phosphatase 103 38 - 126 U/L   Total Bilirubin 0.5 0.3 - 1.2 mg/dL   GFR, Estimated >46 >96 mL/min   Anion gap 12 5 - 15  CBC     Status: Abnormal   Collection Time: 12/14/22  8:31 AM  Result Value Ref Range   WBC 8.7 4.0 - 10.5 K/uL   RBC 3.83 (L) 3.87 - 5.11 MIL/uL   Hemoglobin 10.8 (L) 12.0 - 15.0 g/dL   HCT 29.5 (L) 28.4 - 13.2 %   MCV 85.6 80.0 - 100.0 fL   MCH 28.2 26.0 - 34.0 pg   MCHC 32.9 30.0 - 36.0 g/dL   RDW 44.0 10.2 - 72.5 %   Platelets 402 (H) 150 - 400 K/uL   nRBC 0.0 0.0 - 0.2 %  Beta-hydroxybutyric acid     Status: Abnormal   Collection Time: 12/14/22  8:32 AM  Result Value Ref Range   Beta-Hydroxybutyric Acid 1.23 (H) 0.05 - 0.27 mmol/L  Urinalysis, Routine w reflex microscopic -Urine, Clean Catch     Status: Abnormal   Collection Time: 12/14/22  8:50 AM  Result Value Ref Range   Color, Urine YELLOW YELLOW   APPearance CLEAR CLEAR   Specific Gravity, Urine 1.037 (H) 1.005 - 1.030   pH 5.0 5.0 - 8.0   Glucose, UA >=500 (A) NEGATIVE mg/dL   Hgb urine dipstick NEGATIVE NEGATIVE   Bilirubin Urine NEGATIVE NEGATIVE   Ketones, ur 80 (A) NEGATIVE mg/dL   Protein, ur NEGATIVE NEGATIVE mg/dL   Nitrite NEGATIVE NEGATIVE   Leukocytes,Ua NEGATIVE NEGATIVE   RBC / HPF 0-5 0 - 5 RBC/hpf   WBC, UA 0-5 0 - 5 WBC/hpf   Bacteria, UA RARE (A) NONE SEEN   Squamous Epithelial / HPF 6-10 0 - 5 /HPF  Glucose, capillary     Status: Abnormal   Collection Time: 12/14/22 10:38 AM  Result Value Ref Range   Glucose-Capillary 194 (H) 70 - 99 mg/dL  Glucose, capillary     Status: Abnormal   Collection Time: 12/14/22 11:11 AM  Result Value Ref Range   Glucose-Capillary 191 (H) 70 - 99 mg/dL  Glucose, capillary     Status: Abnormal   Collection Time:  12/14/22 11:45 AM  Result Value Ref Range   Glucose-Capillary 168 (H) 70 - 99 mg/dL    *Consult with Dr. Charlotta Newton @ 1320 - notified of patient's complaints, assessments, lab & CBG results, recommended tx plan d/c home with anti-emetic and F/U with endocrinologist in 1 week - ok to d/c home  Assessment and Plan  1. Hyperglycemia without ketosis - F/U with endocrinologist in 1 week to manage Dexcom and Omnipod 5 - Call for an appointment  2. Nausea and vomiting, unspecified vomiting type - Information provided on N/V in adult - Rx: Zofran 8 mg ODT every 8 hours prn N/V   3. [redacted] weeks gestation of pregnancy   - Discharge patient - Keep scheduled appt with [facility] - Patient verbalized an understanding of the plan of care and agrees.   Raelyn Mora, CNM 12/14/2022, 8:35 AM

## 2022-12-14 NOTE — MAU Note (Signed)
Called Main Lab to request blood to be ran as it was sent at [phone] and still says collected.

## 2022-12-14 NOTE — MAU Note (Addendum)
.  Phyllis [name] is a 22 y.o. at [redacted]w[redacted]d here in MAU reporting: Started feeling intermittent lower abdominal cramping around 0530 and then around 0600 she got nauseated and vomited. Reports three episodes of emesis since 0600. Reports she was dx with a yeast infection this past Monday but has not been able to pick up her medications. Reports vaginal bleeding. Denies VB. +FM.  T1DM. POCT CBG 251. Loose stools upon arrival to MAU.  Onset of complaint: 0500 Pain score: 3/10 lower abdomen FHT: 155 initial external Lab orders placed from triage: POCT CBG, UA

## 2022-12-14 NOTE — MAU Note (Signed)
Diabetes Coordinator at bedside.

## 2022-12-14 NOTE — Inpatient Diabetes Management (Signed)
Inpatient Diabetes Program Recommendations  ADA Standards of Care 2023 Diabetes in Pregnancy Target Glucose Ranges:  Fasting: 70 - 95 mg/dL 1 hr postprandial:  161 - 140mg /dL (from first bite of meal) 2 hr postprandial:  100 - 120 mg/dL (from first bit of meal)     Lab Results  Component Value Date   GLUCAP 168 (H) 12/14/2022   HGBA1C 10.3 (A) 07/19/2022    Review of Glycemic Control  Latest Reference Range & Units 12/14/22 08:05 12/14/22 10:38 12/14/22 11:11 12/14/22 11:45  Glucose-Capillary 70 - 99 mg/dL 096 (H) 045 (H) 409 (H) 168 (H)  (H): Data is abnormally high  Diabetes history: T1DM  Outpatient Diabetes medications:  Basal Rates 0.55/hr--13.2 units/day  Insulin to Carbohydrate Ratio 12AM 10  6am 8   Insulin Sensitivity Factor 12AM 50  6am 35 9pm 35  Target Blood Glucose 12AM 150  6am 130  9pm 150   Current orders for Inpatient glycemic control: Had Novolog 5 units X 1   Spoke with patient at bedside.  She presents with contractions and nausea.  Her insulin pump is currently in place.  She wears the Dexcom G6.  She changes her pump every 3 days.  She changed her pump this morning.  She is current with her endocrinologist, Dr. Vinie Sill.  Last visit was in September and her basal rates were decreased due to having too many lows.  Her glucose was 251 mg/dL on arrival today.  Encouraged her to try to get her glucose trends to above standards.  We discussed impact of elevated glucose on fetus and increased risk for NICU admission.  She verbalizes understanding.    Will continue to follow while inpatient.  Thank you, Dulce Sellar, MSN, CDCES Diabetes Coordinator Inpatient Diabetes Program [phone] (team pager from 8a-5p)

## 2023-01-02 ENCOUNTER — Inpatient Hospital Stay (HOSPITAL_COMMUNITY)
Admission: AD | Admit: 2023-01-02 | Discharge: 2023-01-02 | Disposition: A | Discharge status: Home / Self Care | Payer: Medicaid Other | Attending: Obstetrics & Gynecology | Admitting: Obstetrics & Gynecology

## 2023-01-02 ENCOUNTER — Inpatient Hospital Stay (HOSPITAL_COMMUNITY): Payer: Medicaid Other

## 2023-01-02 ENCOUNTER — Encounter (HOSPITAL_COMMUNITY): Payer: Self-pay | Admitting: Obstetrics and Gynecology

## 2023-01-02 ENCOUNTER — Other Ambulatory Visit: Payer: Self-pay

## 2023-01-02 DIAGNOSIS — Z3A33 33 weeks gestation of pregnancy: Secondary | ICD-10-CM

## 2023-01-02 DIAGNOSIS — E1065 Type 1 diabetes mellitus with hyperglycemia: Secondary | ICD-10-CM | POA: Diagnosis not present

## 2023-01-02 DIAGNOSIS — Z0379 Encounter for other suspected maternal and fetal conditions ruled out: Secondary | ICD-10-CM | POA: Insufficient documentation

## 2023-01-02 DIAGNOSIS — Z9641 Presence of insulin pump (external) (internal): Secondary | ICD-10-CM | POA: Diagnosis not present

## 2023-01-02 DIAGNOSIS — E109 Type 1 diabetes mellitus without complications: Secondary | ICD-10-CM | POA: Diagnosis not present

## 2023-01-02 DIAGNOSIS — O24013 Pre-existing diabetes mellitus, type 1, in pregnancy, third trimester: Secondary | ICD-10-CM

## 2023-01-02 DIAGNOSIS — Z3689 Encounter for other specified antenatal screening: Secondary | ICD-10-CM

## 2023-01-02 NOTE — MAU Provider Note (Signed)
History     CSN: [id number]  Arrival date and time: 01/02/23 0934   Event Date/Time   First Provider Initiated Contact with Patient 01/02/23 1029      Chief Complaint  Patient presents with   Fetal Monitoring   Phyllis [name] , a  22 y.o. G1P0 at [redacted]w[redacted]d presents to MAU without complaints. She was sent over from the office for prolonged fetal monitoring and a repeat BPP after receiving 6/8 today in office. 2 off for breathing at 33 weeks. She denies vaginal bleeding, leaking of fluid and abnormal vaginal discharge. She endorses positive fetal movement. Hx of uncontrolled Type I diabetes. Planning IOL at 37 weeks.   Report Received from Dr. Reina Fuse @ 0900.          OB History     Gravida  1   Para      Term      Preterm      AB      Living         SAB      IAB      Ectopic      Multiple      Live Births              Past Medical History:  Diagnosis Date   Diabetes mellitus type 1 (HCC)    Insulin pump; history of DKA 01/19/2012   Insulin pump in place    Precocious puberty 12/2011   Seizures [facility]) age 35   x 1 - due to low blood sugar   Tooth loose 02/27/2012   x 1 - upper    Past Surgical History:  Procedure Laterality Date   SUPPRELIN IMPLANT  03/01/2012   Procedure: SUPPRELIN IMPLANT;  Surgeon: Judie Petit. Leonia Corona, MD;  Location: [facility];  Service: Pediatrics;  Laterality: Right;  RIGHT ARM    SUPPRELIN REMOVAL Right 08/16/2012   Procedure: SUPPRELIN REMOVAL;  Surgeon: Judie Petit. Leonia Corona, MD;  Location:  SURGERY CENTER;  Service: Pediatrics;  Laterality: Right;    Family History  Problem Relation Age of Onset   Hypertension Mother    Hypertension Maternal Grandfather    Migraines Maternal Grandmother    Depression Maternal Grandmother    Anxiety disorder Maternal Grandmother    Depression Maternal Aunt     Social History   Tobacco Use   Smoking status: Never   Smokeless tobacco: Never   Tobacco comments:     no smokers in home  Vaping Use   Vaping status: Never Used  Substance Use Topics   Alcohol use: No   Drug use: No    Allergies: No Known Allergies  Medications Prior to Admission  Medication Sig Dispense Refill Last Dose   fluconazole (DIFLUCAN) 150 MG tablet Take 150 mg by mouth daily.   Past Week   insulin lispro (HUMALOG) 100 UNIT/ML injection INJECT 300 UNITS WITH INSULIN PUMP EVERY 48 HOURS. 150 mL 2 01/02/2023 at 0945   lidocaine-prilocaine (EMLA) cream APPLY PRIOR TO PUMP SITE CHANGES AS DIRECTED 60 g 3 01/01/2023 at 0200   Prenatal Vit-Fe Fumarate-FA (MULTIVITAMIN-PRENATAL) 27-0.8 MG TABS tablet Take 1 tablet by mouth daily at 12 noon.   Past Week   Accu-Chek Softclix Lancets lancets Use one lancet as directed to monitor BG up to 6x daily (Patient not taking: Reported on 07/19/2022) 200 each 11    Blood Glucose Monitoring Suppl (ACCU-CHEK GUIDE) w/Device KIT Use 1 kit as directed to monitor BG up to  6x daily 1 kit 6    calcium carbonate (TUMS - DOSED IN MG ELEMENTAL CALCIUM) 500 MG chewable tablet Chew 1 tablet (200 mg of elemental calcium total) by mouth 2 (two) times daily as needed for indigestion or heartburn. (Patient not taking: Reported on 12/10/2020) 30 tablet 0    Continuous Glucose Sensor (DEXCOM G6 SENSOR) MISC Inject 1 applicator into the skin as directed. (change sensor every 10 days). Use to monitor glucose continuously. 3 each 2    Continuous Glucose Transmitter (DEXCOM G6 TRANSMITTER) MISC Inject 1 Device into the skin as directed. (re-use up to 8x with each new sensor). Use to monitor glucose continuously. 1 each 3    famotidine (PEPCID) 20 MG tablet TAKE 1 TABLET (20 MG TOTAL) BY MOUTH DAILY. (Patient not taking: Reported on 12/10/2020) 30 tablet 0    Glucagon (BAQSIMI TWO PACK) 3 MG/DOSE POWD Place 1 Units into the nose as needed. 1 each 5    glucagon (GLUCAGON EMERGENCY) 1 MG injection Inject 1 mg IM for severe hypoglycemia. (Patient not taking: Reported on  03/24/2020) 3 kit 4    glucose blood (ACCU-CHEK GUIDE) test strip Use one test strip as directed to monitor BG up to 6x daily (Patient not taking: Reported on 07/19/2022) 200 each 11    ibuprofen (ADVIL,MOTRIN) 200 MG tablet Take 400 mg by mouth every 6 (six) hours as needed for moderate pain. (Patient not taking: Reported on 12/10/2020)      Insulin Disposable Pump (OMNIPOD 5 G6 INTRO, GEN 5,) KIT Inject 1 kit into the skin as directed. . Change pod every 2 days. Intro kit comes with 2 boxes of pods, PDM device, pod pals, and user manual. Please fill for Omnipod 5 Into kit NDC [phone] (Patient not taking: Reported on 03/24/2022) 1 kit 2    Insulin Disposable Pump (OMNIPOD 5 G6 POD, GEN 5,) MISC Inject 1 Device into the skin as directed. Change pod every 2 days. Patient will need 3 boxes (each contain 5 pods) for a 30 day supply. Please fill for [facility] [id number]. 15 each 4    insulin glargine (LANTUS SOLOSTAR) 100 UNIT/ML Solostar Pen Inject up to 50 units as instructed by provider (21 currently) 15 mL 4    Insulin Pen Needle (BD PEN NEEDLE NANO U/F) 32G X 4 MM MISC Use up to 6 x per day for injections 200 each 4    Lancets (ACCU-CHEK MULTICLIX) lancets USE AS DIRECTED TO TEST BLOOD GLUCOSE 6 TIMES DAILY 200 each 5    NOVOLOG FLEXPEN 100 UNIT/ML FlexPen Inject up to 50 units of Novolog aspart insulin as needed. (Patient not taking: Reported on 07/19/2022) 15 mL 3    ondansetron (ZOFRAN-ODT) 8 MG disintegrating tablet Take 1 tablet (8 mg total) by mouth every 8 (eight) hours as needed for nausea or vomiting. 40 tablet 0     Review of Systems  Constitutional:  Negative for chills, fatigue and fever.  Eyes:  Negative for pain and visual disturbance.  Respiratory:  Negative for apnea, shortness of breath and wheezing.   Cardiovascular:  Negative for chest pain and palpitations.  Gastrointestinal:  Negative for abdominal pain, constipation, diarrhea, nausea and vomiting.  Genitourinary:  Negative for  difficulty urinating, dysuria, pelvic pain, vaginal bleeding, vaginal discharge and vaginal pain.  Musculoskeletal:  Negative for back pain.  Neurological:  Negative for seizures, weakness and headaches.  Psychiatric/Behavioral:  Negative for suicidal ideas.    Physical Exam   Blood pressure 121/70, pulse Marland Kitchen)  109, temperature 98.6 F (37 C), temperature source Oral, resp. rate 16, height 5' (1.524 m), weight 63.5 kg.  Physical Exam Vitals and nursing note reviewed.  Constitutional:      General: She is not in acute distress.    Appearance: Normal appearance.  HENT:     Head: Normocephalic.  Pulmonary:     Effort: Pulmonary effort is normal.  Musculoskeletal:     Cervical back: Normal range of motion.  Skin:    General: Skin is warm and dry.  Neurological:     Mental Status: She is alert and oriented to person, place, and time.  Psychiatric:        Mood and Affect: Mood normal.    FHT: 150 moderate variability 10x10 accels noted. No decles.  Toco: Quiet   MAU Course  Procedures Monitors applied and assessing.   MDM - Plan to repeat BPP at 1pm - FHT remained appropriate for gestational age while in MAU.  - Repeat BPP 8/8.  - Plan for discharge   Assessment and Plan   1. Type 1 diabetes mellitus without complication (HCC)   2. [redacted] weeks gestation of pregnancy   3. NST (non-stress test) reactive    - Reviewed normal findings of BPP with patient and FOB.  - Repeat BPP scheduled for OP in the office later in the week.  - FHT appropriate for gestational age upon discharge.  - Reviewed worsening signs and return precautions  - Patient discharged home in stable condition and may return to MAU as needed.   Claudette Head, MSN CNM  01/02/2023, 10:29 AM

## 2023-01-02 NOTE — MAU Note (Signed)
Phyllis [name] is a 22 y.o. at [redacted]w[redacted]d here in MAU reporting: She was seen in the office today and was told to come to MAU for prolonged monitoring. Denies VB, LOF, reports +FM   Onset of complaint: 0845 Pain score: 3/10 Vitals:   01/02/23 1011  BP: 121/70  Pulse: (!) 109  Resp: 16  Temp: 98.6 F (37 C)     FHT:158 Lab orders placed from triage:  none

## 2023-01-18 LAB — OB RESULTS CONSOLE GBS: GBS: NEGATIVE

## 2023-01-23 ENCOUNTER — Telehealth (HOSPITAL_COMMUNITY): Payer: Self-pay | Admitting: *Deleted

## 2023-01-23 ENCOUNTER — Encounter (HOSPITAL_COMMUNITY): Payer: Self-pay | Admitting: *Deleted

## 2023-01-23 NOTE — Telephone Encounter (Signed)
Preadmission screen  

## 2023-01-23 NOTE — Telephone Encounter (Signed)
Benedict Needy is endocrinologist instructed pt to contact her Endocrinologist for pump change settings and if those were not obtained we would have to stop her pump and use either an insulin drip or SQ insulin.  Verbalized understanding.

## 2023-01-27 ENCOUNTER — Other Ambulatory Visit: Payer: Self-pay | Admitting: Obstetrics and Gynecology

## 2023-01-27 DIAGNOSIS — O24013 Pre-existing diabetes mellitus, type 1, in pregnancy, third trimester: Secondary | ICD-10-CM

## 2023-01-28 ENCOUNTER — Encounter (HOSPITAL_COMMUNITY): Payer: Self-pay | Admitting: Obstetrics and Gynecology

## 2023-01-28 ENCOUNTER — Encounter (HOSPITAL_COMMUNITY)
Admission: RE | Disposition: A | Discharge status: Home / Self Care | Payer: Self-pay | Source: Home / Self Care | Attending: Obstetrics and Gynecology

## 2023-01-28 ENCOUNTER — Inpatient Hospital Stay (HOSPITAL_COMMUNITY): Payer: Medicaid Other | Admitting: Anesthesiology

## 2023-01-28 ENCOUNTER — Other Ambulatory Visit: Payer: Self-pay

## 2023-01-28 ENCOUNTER — Inpatient Hospital Stay (HOSPITAL_COMMUNITY)
Admission: RE | Admit: 2023-01-28 | Discharge: 2023-01-30 | DRG: 788 | Disposition: A | Discharge status: Home / Self Care | Payer: Medicaid Other | Attending: Obstetrics and Gynecology | Admitting: Obstetrics and Gynecology

## 2023-01-28 ENCOUNTER — Inpatient Hospital Stay (HOSPITAL_COMMUNITY): Payer: Medicaid Other

## 2023-01-28 DIAGNOSIS — Z8249 Family history of ischemic heart disease and other diseases of the circulatory system: Secondary | ICD-10-CM | POA: Diagnosis not present

## 2023-01-28 DIAGNOSIS — K5900 Constipation, unspecified: Secondary | ICD-10-CM | POA: Diagnosis present

## 2023-01-28 DIAGNOSIS — Z9641 Presence of insulin pump (external) (internal): Secondary | ICD-10-CM | POA: Diagnosis present

## 2023-01-28 DIAGNOSIS — Z3A37 37 weeks gestation of pregnancy: Secondary | ICD-10-CM

## 2023-01-28 DIAGNOSIS — O2402 Pre-existing diabetes mellitus, type 1, in childbirth: Principal | ICD-10-CM | POA: Diagnosis present

## 2023-01-28 DIAGNOSIS — O9081 Anemia of the puerperium: Secondary | ICD-10-CM | POA: Diagnosis not present

## 2023-01-28 DIAGNOSIS — E1065 Type 1 diabetes mellitus with hyperglycemia: Secondary | ICD-10-CM | POA: Diagnosis present

## 2023-01-28 DIAGNOSIS — Z818 Family history of other mental and behavioral disorders: Secondary | ICD-10-CM | POA: Diagnosis not present

## 2023-01-28 DIAGNOSIS — Z794 Long term (current) use of insulin: Secondary | ICD-10-CM

## 2023-01-28 DIAGNOSIS — O24013 Pre-existing diabetes mellitus, type 1, in pregnancy, third trimester: Principal | ICD-10-CM | POA: Diagnosis present

## 2023-01-28 LAB — COMPREHENSIVE METABOLIC PANEL
ALT: 13 U/L (ref 0–44)
AST: 20 U/L (ref 15–41)
Albumin: 2.7 g/dL — ABNORMAL LOW (ref 3.5–5.0)
Alkaline Phosphatase: 182 U/L — ABNORMAL HIGH (ref 38–126)
Anion gap: 9 (ref 5–15)
BUN: 13 mg/dL (ref 6–20)
CO2: 19 mmol/L — ABNORMAL LOW (ref 22–32)
Calcium: 9.7 mg/dL (ref 8.9–10.3)
Chloride: 104 mmol/L (ref 98–111)
Creatinine, Ser: 0.56 mg/dL (ref 0.44–1.00)
GFR, Estimated: >60 mL/min (ref >60)
Glucose, Bld: 217 mg/dL — ABNORMAL HIGH (ref 70–99)
Potassium: 4.2 mmol/L (ref 3.5–5.1)
Sodium: 132 mmol/L — ABNORMAL LOW (ref 135–145)
Total Bilirubin: 0.7 mg/dL (ref <1.2)
Total Protein: 6.4 g/dL — ABNORMAL LOW (ref 6.5–8.1)

## 2023-01-28 LAB — BASIC METABOLIC PANEL
Anion gap: 8 (ref 5–15)
BUN: 13 mg/dL (ref 6–20)
CO2: 20 mmol/L — ABNORMAL LOW (ref 22–32)
Calcium: 9.5 mg/dL (ref 8.9–10.3)
Chloride: 103 mmol/L (ref 98–111)
Creatinine, Ser: 0.53 mg/dL (ref 0.44–1.00)
GFR, Estimated: >60 mL/min (ref >60)
Glucose, Bld: 215 mg/dL — ABNORMAL HIGH (ref 70–99)
Potassium: 3.8 mmol/L (ref 3.5–5.1)
Sodium: 131 mmol/L — ABNORMAL LOW (ref 135–145)

## 2023-01-28 LAB — GLUCOSE, CAPILLARY
Glucose-Capillary: 143 mg/dL — ABNORMAL HIGH (ref 70–99)
Glucose-Capillary: 166 mg/dL — ABNORMAL HIGH (ref 70–99)
Glucose-Capillary: 53 mg/dL — ABNORMAL LOW (ref 70–99)
Glucose-Capillary: 143 mg/dL — ABNORMAL HIGH (ref 70–99)
Glucose-Capillary: 195 mg/dL — ABNORMAL HIGH (ref 70–99)
Glucose-Capillary: 183 mg/dL — ABNORMAL HIGH (ref 70–99)
Glucose-Capillary: 189 mg/dL — ABNORMAL HIGH (ref 70–99)
Glucose-Capillary: 130 mg/dL — ABNORMAL HIGH (ref 70–99)

## 2023-01-28 LAB — CBC
HCT: 31.9 % — ABNORMAL LOW (ref 36.0–46.0)
Hemoglobin: 9.8 g/dL — ABNORMAL LOW (ref 12.0–15.0)
MCH: 24.4 pg — ABNORMAL LOW (ref 26.0–34.0)
MCHC: 30.7 g/dL (ref 30.0–36.0)
MCV: 79.6 fL — ABNORMAL LOW (ref 80.0–100.0)
Platelets: 400 10*3/uL (ref 150–400)
RBC: 4.01 MIL/uL (ref 3.87–5.11)
RDW: 13.5 % (ref 11.5–15.5)
WBC: 8.1 10*3/uL (ref 4.0–10.5)
nRBC: 0 % (ref 0.0–0.2)

## 2023-01-28 LAB — TYPE AND SCREEN
ABO/RH(D): A POS
Antibody Screen: NEGATIVE

## 2023-01-28 LAB — HEMOGLOBIN A1C
Hgb A1c MFr Bld: 10.1 % — ABNORMAL HIGH (ref 4.8–5.6)
Mean Plasma Glucose: 243.17 mg/dL

## 2023-01-28 LAB — RPR: RPR Ser Ql: NONREACTIVE

## 2023-01-28 SURGERY — Surgical Case
Anesthesia: Spinal

## 2023-01-28 MED ORDER — ACETAMINOPHEN 500 MG PO TABS
1000.0000 mg | ORAL_TABLET | Freq: Four times a day (QID) | ORAL | Status: DC
  Filled 2023-01-28: qty 2

## 2023-01-28 MED ORDER — OXYTOCIN-SODIUM CHLORIDE 30-0.9 UT/500ML-% IV SOLN
INTRAVENOUS | Status: DC | PRN
  Administered 2023-01-28: 400 mL via INTRAVENOUS

## 2023-01-28 MED ORDER — LACTATED RINGERS IV SOLN: INTRAVENOUS | Status: DC

## 2023-01-28 MED ORDER — SENNOSIDES-DOCUSATE SODIUM 8.6-50 MG PO TABS
2.0000 | ORAL_TABLET | Freq: Every day | ORAL | Status: DC
  Administered 2023-01-29 (×2): 2 via ORAL
  Filled 2023-01-28 (×3): qty 2

## 2023-01-28 MED ORDER — PRENATAL MULTIVITAMIN CH: 1.0000 | ORAL_TABLET | Freq: Every day | ORAL | Status: DC

## 2023-01-28 MED ORDER — ACETAMINOPHEN 10 MG/ML IV SOLN
INTRAVENOUS | Status: DC | PRN
  Administered 2023-01-28: 1000 mg via INTRAVENOUS

## 2023-01-28 MED ORDER — ONDANSETRON HCL 4 MG/2ML IJ SOLN
INTRAMUSCULAR | Status: DC | PRN
  Administered 2023-01-28: 4 mg via INTRAVENOUS

## 2023-01-28 MED ORDER — OXYTOCIN-SODIUM CHLORIDE 30-0.9 UT/500ML-% IV SOLN: 1.0000 m[IU]/min | INTRAVENOUS | Status: DC

## 2023-01-28 MED ORDER — OXYTOCIN BOLUS FROM INFUSION: 333.0000 mL | Freq: Once | INTRAVENOUS | Status: DC

## 2023-01-28 MED ORDER — ONDANSETRON HCL 4 MG/2ML IJ SOLN
INTRAMUSCULAR | Status: AC
Start: 2023-01-28 — End: ?
  Filled 2023-01-28: qty 2

## 2023-01-28 MED ORDER — PHENYLEPHRINE HCL-NACL 20-0.9 MG/250ML-% IV SOLN
INTRAVENOUS | Status: DC | PRN
  Administered 2023-01-28: 60 ug/min via INTRAVENOUS

## 2023-01-28 MED ORDER — ONDANSETRON HCL 4 MG PO TABS
4.0000 mg | ORAL_TABLET | Freq: Three times a day (TID) | ORAL | Status: DC | PRN
  Administered 2023-01-28: 4 mg via ORAL
  Filled 2023-01-28: qty 1

## 2023-01-28 MED ORDER — MORPHINE SULFATE (PF) 0.5 MG/ML IJ SOLN
INTRAMUSCULAR | Status: AC
  Filled 2023-01-28: qty 10

## 2023-01-28 MED ORDER — ZOLPIDEM TARTRATE 5 MG PO TABS: 5.0000 mg | ORAL_TABLET | Freq: Every evening | ORAL | Status: DC | PRN

## 2023-01-28 MED ORDER — ACETAMINOPHEN 160 MG/5ML PO SOLN
1000.0000 mg | Freq: Four times a day (QID) | ORAL | Status: DC
  Administered 2023-01-28 – 2023-01-30 (×7): 1000 mg via ORAL
  Filled 2023-01-28 (×7): qty 40.6

## 2023-01-28 MED ORDER — DIBUCAINE (PERIANAL) 1 % EX OINT: 1.0000 | TOPICAL_OINTMENT | CUTANEOUS | Status: DC | PRN

## 2023-01-28 MED ORDER — ONDANSETRON HCL 4 MG/2ML IJ SOLN
4.0000 mg | Freq: Three times a day (TID) | INTRAMUSCULAR | Status: DC | PRN
  Administered 2023-01-28: 4 mg via INTRAVENOUS
  Filled 2023-01-28: qty 2

## 2023-01-28 MED ORDER — MISOPROSTOL 25 MCG QUARTER TABLET
25.0000 ug | ORAL_TABLET | ORAL | Status: DC | PRN
  Administered 2023-01-28: 25 ug via VAGINAL
  Filled 2023-01-28 (×3): qty 1

## 2023-01-28 MED ORDER — KETOROLAC TROMETHAMINE 30 MG/ML IJ SOLN
30.0000 mg | Freq: Four times a day (QID) | INTRAMUSCULAR | Status: AC
  Administered 2023-01-28 – 2023-01-29 (×4): 30 mg via INTRAVENOUS
  Filled 2023-01-28 (×4): qty 1

## 2023-01-28 MED ORDER — AZITHROMYCIN 500 MG IV SOLR
500.0000 mg | INTRAVENOUS | Status: AC
  Administered 2023-01-28: 500 mg via INTRAVENOUS
  Filled 2023-01-28: qty 5

## 2023-01-28 MED ORDER — SOD CITRATE-CITRIC ACID 500-334 MG/5ML PO SOLN
30.0000 mL | ORAL | Status: DC | PRN
  Filled 2023-01-28: qty 30

## 2023-01-28 MED ORDER — FENTANYL CITRATE (PF) 100 MCG/2ML IJ SOLN: 50.0000 ug | INTRAMUSCULAR | Status: DC | PRN

## 2023-01-28 MED ORDER — LIDOCAINE HCL (PF) 1 % IJ SOLN: 30.0000 mL | INTRAMUSCULAR | Status: DC | PRN

## 2023-01-28 MED ORDER — MORPHINE SULFATE (PF) 0.5 MG/ML IJ SOLN
INTRAMUSCULAR | Status: DC | PRN
  Administered 2023-01-28: 150 ug via INTRATHECAL

## 2023-01-28 MED ORDER — SIMETHICONE 80 MG PO CHEW
80.0000 mg | CHEWABLE_TABLET | Freq: Three times a day (TID) | ORAL | Status: DC
  Administered 2023-01-29 – 2023-01-30 (×3): 80 mg via ORAL
  Filled 2023-01-28 (×5): qty 1

## 2023-01-28 MED ORDER — OXYTOCIN-SODIUM CHLORIDE 30-0.9 UT/500ML-% IV SOLN
2.5000 [IU]/h | INTRAVENOUS | Status: AC
Start: 2023-01-28 — End: 2023-01-29
  Administered 2023-01-28: 2.5 [IU]/h via INTRAVENOUS
  Filled 2023-01-28: qty 500

## 2023-01-28 MED ORDER — ONDANSETRON HCL 4 MG/2ML IJ SOLN
4.0000 mg | Freq: Four times a day (QID) | INTRAMUSCULAR | Status: DC | PRN
  Administered 2023-01-28: 4 mg via INTRAVENOUS
  Filled 2023-01-28: qty 2

## 2023-01-28 MED ORDER — OXYCODONE HCL 5 MG PO TABS
5.0000 mg | ORAL_TABLET | ORAL | Status: DC | PRN
Start: 2023-01-28 — End: 2023-01-30

## 2023-01-28 MED ORDER — OXYCODONE-ACETAMINOPHEN 5-325 MG PO TABS: 1.0000 | ORAL_TABLET | ORAL | Status: DC | PRN

## 2023-01-28 MED ORDER — INSULIN PUMP
Freq: Three times a day (TID) | SUBCUTANEOUS | Status: DC
  Administered 2023-01-28: 3.7 via SUBCUTANEOUS
  Administered 2023-01-29: 3.15 via SUBCUTANEOUS
  Administered 2023-01-29: 3.7 via SUBCUTANEOUS
  Administered 2023-01-30: 0.1 via SUBCUTANEOUS
  Filled 2023-01-28: qty 1

## 2023-01-28 MED ORDER — MISOPROSTOL 25 MCG QUARTER TABLET
25.0000 ug | ORAL_TABLET | ORAL | Status: DC | PRN
  Administered 2023-01-28: 25 ug via ORAL

## 2023-01-28 MED ORDER — ACETAMINOPHEN 325 MG PO TABS: 650.0000 mg | ORAL_TABLET | ORAL | Status: DC | PRN

## 2023-01-28 MED ORDER — DEXTROSE 50 % IV SOLN: 0.0000 mL | INTRAVENOUS | Status: DC | PRN

## 2023-01-28 MED ORDER — DEXTROSE IN LACTATED RINGERS 5 % IV SOLN: INTRAVENOUS | Status: DC

## 2023-01-28 MED ORDER — INSULIN REGULAR(HUMAN) IN NACL 100-0.9 UT/100ML-% IV SOLN
INTRAVENOUS | Status: DC
  Administered 2023-01-28: 1.4 [IU]/h via INTRAVENOUS
  Filled 2023-01-28: qty 100

## 2023-01-28 MED ORDER — SIMETHICONE 80 MG PO CHEW: 80.0000 mg | CHEWABLE_TABLET | ORAL | Status: DC | PRN

## 2023-01-28 MED ORDER — MENTHOL 3 MG MT LOZG: 1.0000 | LOZENGE | OROMUCOSAL | Status: DC | PRN

## 2023-01-28 MED ORDER — OXYTOCIN-SODIUM CHLORIDE 30-0.9 UT/500ML-% IV SOLN: 2.5000 [IU]/h | INTRAVENOUS | Status: DC

## 2023-01-28 MED ORDER — DIPHENHYDRAMINE HCL 25 MG PO CAPS: 25.0000 mg | ORAL_CAPSULE | Freq: Four times a day (QID) | ORAL | Status: DC | PRN

## 2023-01-28 MED ORDER — WITCH HAZEL-GLYCERIN EX PADS: 1.0000 | MEDICATED_PAD | CUTANEOUS | Status: DC | PRN

## 2023-01-28 MED ORDER — FENTANYL CITRATE (PF) 100 MCG/2ML IJ SOLN
INTRAMUSCULAR | Status: AC
  Filled 2023-01-28: qty 2

## 2023-01-28 MED ORDER — IBUPROFEN 600 MG PO TABS: 600.0000 mg | ORAL_TABLET | Freq: Four times a day (QID) | ORAL | Status: DC

## 2023-01-28 MED ORDER — OXYCODONE-ACETAMINOPHEN 5-325 MG PO TABS: 2.0000 | ORAL_TABLET | ORAL | Status: DC | PRN

## 2023-01-28 MED ORDER — IBUPROFEN 100 MG/5ML PO SUSP
600.0000 mg | Freq: Four times a day (QID) | ORAL | Status: DC
  Administered 2023-01-29 – 2023-01-30 (×4): 600 mg via ORAL
  Filled 2023-01-28 (×4): qty 30

## 2023-01-28 MED ORDER — CEFAZOLIN SODIUM-DEXTROSE 2-4 GM/100ML-% IV SOLN
2.0000 g | INTRAVENOUS | Status: AC
  Administered 2023-01-28: 2 g via INTRAVENOUS

## 2023-01-28 MED ORDER — COCONUT OIL OIL: 1.0000 | TOPICAL_OIL | Status: DC | PRN

## 2023-01-28 MED ORDER — DEXTROSE IN LACTATED RINGERS 5 % IV SOLN
INTRAVENOUS | Status: AC
  Administered 2023-01-29: 500 mL via INTRAVENOUS

## 2023-01-28 MED ORDER — FENTANYL CITRATE (PF) 100 MCG/2ML IJ SOLN
INTRAMUSCULAR | Status: DC | PRN
  Administered 2023-01-28: 15 ug via INTRATHECAL

## 2023-01-28 MED ORDER — LACTATED RINGERS IV SOLN: INTRAVENOUS | Status: DC | PRN

## 2023-01-28 MED ORDER — COMPLETENATE 29-1 MG PO CHEW
1.0000 | CHEWABLE_TABLET | Freq: Every day | ORAL | Status: DC
  Administered 2023-01-29 – 2023-01-30 (×2): 1 via ORAL
  Filled 2023-01-28 (×2): qty 1

## 2023-01-28 MED ORDER — BUPIVACAINE IN DEXTROSE 0.75-8.25 % IT SOLN
INTRATHECAL | Status: DC | PRN
  Administered 2023-01-28: 1.6 mL via INTRATHECAL

## 2023-01-28 MED ORDER — SOD CITRATE-CITRIC ACID 500-334 MG/5ML PO SOLN
30.0000 mL | ORAL | Status: AC
  Administered 2023-01-28: 30 mL via ORAL

## 2023-01-28 MED ORDER — TERBUTALINE SULFATE 1 MG/ML IJ SOLN: 0.2500 mg | Freq: Once | INTRAMUSCULAR | Status: DC | PRN

## 2023-01-28 MED ORDER — LACTATED RINGERS IV SOLN
500.0000 mL | INTRAVENOUS | Status: DC | PRN
Start: 2023-01-28 — End: 2023-01-28
  Administered 2023-01-28: 500 mL via INTRAVENOUS

## 2023-01-28 MED ORDER — SODIUM CHLORIDE 0.9 % IV SOLN
12.5000 mg | Freq: Once | INTRAVENOUS | Status: AC
  Administered 2023-01-29: 12.5 mg via INTRAVENOUS
  Filled 2023-01-28: qty 12.5

## 2023-01-28 SURGICAL SUPPLY — 34 items
BENZOIN TINCTURE PRP APPL 2/3 (GAUZE/BANDAGES/DRESSINGS) IMPLANT
CHLORAPREP W/TINT 26 (MISCELLANEOUS) ×2 IMPLANT
CLAMP UMBILICAL CORD (MISCELLANEOUS) ×1 IMPLANT
CLOTH BEACON ORANGE TIMEOUT ST (SAFETY) ×1 IMPLANT
DERMABOND ADVANCED .7 DNX12 (GAUZE/BANDAGES/DRESSINGS) ×1 IMPLANT
DRSG OPSITE POSTOP 4X10 (GAUZE/BANDAGES/DRESSINGS) ×1 IMPLANT
ELECT REM PT RETURN 9FT ADLT (ELECTROSURGICAL) ×1
ELECTRODE REM PT RTRN 9FT ADLT (ELECTROSURGICAL) ×1 IMPLANT
EXTRACTOR VACUUM BELL STYLE (SUCTIONS) IMPLANT
GAUZE SPONGE 4X4 12PLY STRL LF (GAUZE/BANDAGES/DRESSINGS) IMPLANT
GLOVE BIO SURGEON STRL SZ 6.5 (GLOVE) ×1 IMPLANT
GLOVE BIOGEL PI IND STRL 6.5 (GLOVE) ×1 IMPLANT
GLOVE BIOGEL PI IND STRL 7.0 (GLOVE) ×2 IMPLANT
GOWN STRL REUS W/TWL LRG LVL3 (GOWN DISPOSABLE) ×2 IMPLANT
KIT ABG SYR 3ML LUER SLIP (SYRINGE) IMPLANT
MAT PREVALON FULL STRYKER (MISCELLANEOUS) IMPLANT
NDL HYPO 25X5/8 SAFETYGLIDE (NEEDLE) IMPLANT
NEEDLE HYPO 25X5/8 SAFETYGLIDE (NEEDLE) IMPLANT
NS IRRIG 1000ML POUR BTL (IV SOLUTION) ×1 IMPLANT
PACK C SECTION WH (CUSTOM PROCEDURE TRAY) ×1 IMPLANT
PAD ABD 8X10 STRL (GAUZE/BANDAGES/DRESSINGS) IMPLANT
PAD OB MATERNITY 4.3X12.25 (PERSONAL CARE ITEMS) ×1 IMPLANT
RTRCTR C-SECT PINK 25CM LRG (MISCELLANEOUS) IMPLANT
STRIP CLOSURE SKIN 1/2X4 (GAUZE/BANDAGES/DRESSINGS) IMPLANT
SUT PDS AB 0 CTX 36 PDP370T (SUTURE) IMPLANT
SUT PLAIN ABS 2-0 CT1 27XMFL (SUTURE) IMPLANT
SUT VIC AB 0 CT1 36 (SUTURE) ×2 IMPLANT
SUT VIC AB 2-0 CT1 TAPERPNT 27 (SUTURE) ×1 IMPLANT
SUT VIC AB 3-0 SH 27X BRD (SUTURE) IMPLANT
SUT VIC AB 3-0 SH 27XBRD (SUTURE) IMPLANT
SUT VIC AB 4-0 KS 27 (SUTURE) ×1 IMPLANT
TOWEL OR 17X24 6PK STRL BLUE (TOWEL DISPOSABLE) ×1 IMPLANT
TRAY FOLEY W/BAG SLVR 14FR LF (SET/KITS/TRAYS/PACK) ×1 IMPLANT
WATER STERILE IRR 1000ML POUR (IV SOLUTION) ×1 IMPLANT

## 2023-01-28 NOTE — Anesthesia Procedure Notes (Signed)
Spinal  Patient location during procedure: OR Start time: 01/28/2023 2:43 PM End time: 01/28/2023 2:47 PM Reason for block: surgical anesthesia Staffing Performed: anesthesiologist  Anesthesiologist: Linton Rump, MD Performed by: Linton Rump, MD Authorized by: Linton Rump, MD   Preanesthetic Checklist Completed: patient identified, IV checked, site marked, risks and benefits discussed, surgical consent, monitors and equipment checked, pre-op evaluation and timeout performed Spinal Block Patient position: sitting Prep: DuraPrep Patient monitoring: blood pressure and continuous pulse ox Approach: midline Location: L3-4 Injection technique: single-shot Needle Needle type: Pencan  Needle gauge: 24 G Needle length: 9 cm Assessment Sensory level: T4 Additional Notes Risks and benefits of neuraxial anesthesia including, but not limited to, infection, bleeding, local anesthetic toxicity, headache, hypotension, back pain, block failure, etc. were discussed with the patient. The patient expressed understanding and consented to the procedure. I confirmed that the patient has no bleeding disorders and is not taking blood thinners. I confirmed the patient's last platelet count with the nurse. Monitors were applied. A time-out was performed immediately prior to the procedure. Sterile technique was used throughout the whole procedure.   2 attempt(s)

## 2023-01-28 NOTE — Inpatient Diabetes Management (Addendum)
Inpatient Diabetes Program Recommendations  AACE/ADA: New Consensus Statement on Inpatient Glycemic Control (2015)  Target Ranges:  Prepandial:   less than 140 mg/dL      Peak postprandial:   less than 180 mg/dL (1-2 hours)      Critically ill patients:  140 - 180 mg/dL   Lab Results  Component Value Date   GLUCAP 189 (H) 01/28/2023   HGBA1C 10.1 (H) 01/28/2023   Please order Insulin Pump Therapy order set with glucose checks AC/HS and 2am.    Reached out to mom via phone.  FOB brought PDM (controller for Omnipod) to PACU and started insulin pump.   with Kara Mead, RN.  Glucose reading per CGM was 189 mg/dL at 1610 in PACU.  Mom is going to call this DM coordinator once she is able to visit with patient and we will review pump settings to ensure settings are correct.  Please check CGM glucose AC/HS and 2am and CBG Q shift.  Check CBG if patient has symptoms of high or low glucose and CGM is reading WNL.    Patient should correct her glucose AC/HS and enter CHO's with meals using her insulin pump.  Print off insulin pump flowsheet for patient to record her boluses.    Will addend note once I speak with patient's mom this evening.    Addendum @ 1744:  Patient called this DM coordinator while in PACU.  She placed her insulin pump in auto mode as directed by her endocrinologist.  We reviewed pumps setting.    Insulin carb ratio--1 unit:11 carbs Insulin Sensitivity Factor--1 units brings her down 40 mg/dL Target glucose is 960 mg/dL  Her current Dexcom reading is 166 mg/dL.  DM coordinator will follow glucose trends tomorrow.  Will continue to follow while inpatient.  Thank you, Dulce Sellar, MSN, CDCES Diabetes Coordinator Inpatient Diabetes Program [phone] (team pager from 8a-5p)

## 2023-01-28 NOTE — Transfer of Care (Signed)
Immediate Anesthesia Transfer of Care Note  Patient: Phyllis [name]  Procedure(s) Performed: CESAREAN SECTION  Patient Location: PACU  Anesthesia Type:Spinal  Level of Consciousness: awake  Airway & Oxygen Therapy: Patient Spontanous Breathing  Post-op Assessment: Report given to RN  Post vital signs: Reviewed and stable  Last Vitals:  Vitals Value Taken Time  BP 110/66 01/28/23 1558  Temp    Pulse 84 01/28/23 1604  Resp 13 01/28/23 1604  SpO2 100 % 01/28/23 1604  Vitals shown include unfiled device data.  Last Pain:  Vitals:   01/28/23 1410  TempSrc: Oral  PainSc:          Complications: No notable events documented.

## 2023-01-28 NOTE — H&P (Signed)
Phyllis [name] is a 22 y.o. female presenting for scheduled induction of labor. Received one cytotec PV overnight. Endorsing regular cramping at this time. +FM, denies VB, LOF. Had initially declined Endotool mgmt until discussion this AM  PNC c/b 1) Uncontrolled T1DM - Onset age 89, Omnipod, adult Endo est 5/30, baseline A1c 10.7>9.2 @ 16wk>9.9 @34wks . 10.1 on admission today. Fetal echo with small VSD. 2) VSD - Small mid muscular VSD on color doppler, no major heart disease, otherwise normal anatomy/function - plan for pp echo 24hrs after birth 3) Transient FGR and polyhydramnios -FGR: AC 5.8%tile at 25wks, resolved @ 29 wks -Poly: A 33wks based on MVP >8cm, WNL on weekly BPP afterwards 4) STI in pregnancy: +chlamydia 1st trim, neg TOC plus neg third trim screening  GBS neg.   OB History     Gravida  1   Para      Term      Preterm      AB      Living         SAB      IAB      Ectopic      Multiple      Live Births             Past Medical History:  Diagnosis Date   Diabetes mellitus type 1 (HCC)    Insulin pump; history of DKA 01/19/2012   Insulin pump in place    Precocious puberty 12/30/2011   Seizures (HCC) age 13   x 1 - due to low blood sugar   Tooth loose 02/27/2012   x 1 - upper   Past Surgical History:  Procedure Laterality Date   SUPPRELIN IMPLANT  03/01/2012   Procedure: SUPPRELIN IMPLANT;  Surgeon: Judie Petit. Leonia Corona, MD;  Location: [facility];  Service: Pediatrics;  Laterality: Right;  RIGHT ARM    SUPPRELIN REMOVAL Right 08/16/2012   Procedure: SUPPRELIN REMOVAL;  Surgeon: Judie Petit. Leonia Corona, MD;  Location: [facility];  Service: Pediatrics;  Laterality: Right;   Family History: family history includes Anxiety disorder in her maternal grandmother; Cancer in her maternal grandmother; Depression in her maternal aunt and maternal grandmother; Hypertension in her maternal grandfather and mother; Migraines in her maternal  grandmother. Social History:  reports that she has never smoked. She has never used smokeless tobacco. She reports that she does not drink alcohol and does not use drugs.     Maternal Diabetes: Yes:  Diabetes Type:  Pre-pregnancy Genetic Screening: Normal Maternal Ultrasounds/Referrals: Cardiac defect Fetal Ultrasounds or other Referrals:  Fetal echo Maternal Substance Abuse:  No Significant Maternal Medications:  Meds include: Other: Insulin Significant Maternal Lab Results:  Group B Strep negative Number of Prenatal Visits:greater than 3 verified prenatal visits Maternal Vaccinations:TDap and FluDeclined RSV Other Comments:  None  Review of Systems  Constitutional:  Negative for chills and fever.  Respiratory:  Negative for shortness of breath.   Cardiovascular:  Negative for chest pain, palpitations and leg swelling.  Gastrointestinal:  Negative for abdominal pain and vomiting.  Neurological:  Negative for dizziness, weakness and headaches.  Psychiatric/Behavioral:  Negative for suicidal ideas.    Maternal Medical History:  Fetal activity: Perceived fetal activity is normal.   Prenatal complications: IUGR (resolved) and polyhydramnios (resolved).   No placental abnormality, preterm labor or thrombocytopenia.   Prenatal Complications - Diabetes: type 1. Diabetes is managed by insulin pump.     Dilation: Fingertip Effacement (%): 20 Station: [employer]  Exam by:: Boone Master, RN Blood pressure 100/70, pulse 100, temperature 98 F (36.7 C), temperature source Oral, resp. rate 16, height 5' (1.524 m), weight 63.3 kg. Exam Physical Exam Constitutional:      General: She is not in acute distress.    Appearance: She is well-developed.  HENT:     Head: Normocephalic and atraumatic.  Eyes:     Pupils: Pupils are equal, round, and reactive to light.  Cardiovascular:     Rate and Rhythm: Normal rate and regular rhythm.     Heart sounds: No murmur heard.    No gallop.   Abdominal:     Tenderness: There is no abdominal tenderness. There is no guarding or rebound.  Genitourinary:    Vagina: Normal.     Uterus: Normal.   Musculoskeletal:        General: Normal range of motion.     Cervical back: Normal range of motion and neck supple.  Skin:    General: Skin is warm and dry.  Neurological:     Mental Status: She is alert and oriented to person, place, and time.     Prenatal labs: ABO, Rh: --/--/A POS (11/30 0044) Antibody: NEG (11/30 0044) Rubella: Immune (05/23 0000) RPR: Nonreactive (05/23 0000)  HBsAg: Negative (05/23 0000)  HIV: Non-reactive (05/23 0000)  GBS: Negative/-- (11/20 0000)   Cat 1 tracing, TOCO q75min cyto placed buccally   Assessment/Plan: This is a 22yo G1 @ 37 0/7 by LMP c.w 9wk scan admitted for IOL for uncontrolled T1DM at term. GBS neg, s/p PV cytotec x1, repeat dose placed just now. Reviewed induction process including ripening, possible FB and pitocin with both patient and mother. Aware that process may be lengthy. Also discussed, at length, current T1DM status. A1c on admission 10.1. Patient and mother and FOB are aware of DKA risks as well as chronic issues such as optho care, neuropathy, CKD and cardiac issues. Also aware of fetal implications especially in setting of know VSD. Spoke about [employer] which would involve establishing baseline BG measurement between FSBG and patient's CGM - should these agree, can continue to use CGM during induction process. Hospital Diabetes coordinator is on board and patient has received setting for insulin pump for postpartum. Reviewed that pump may be turned off and that her insulin would be managed in IV fashion. Patient is now amenable to use of Endotool during induction process. Is very intolerant of checks but is amenable to FB placement if amenable on next exam, would likely need IV pain meds prior to placement. Will also keep NICU aware of infant status given known VSD,  need for pp echocardiogram and uncontrolled maternal BG during intrapartum. All questions answered extensively with family and RN present.   Valerie Roys Siddhi Dornbush 01/28/2023, 8:52 AM

## 2023-01-28 NOTE — Anesthesia Postprocedure Evaluation (Deleted)
Anesthesia Post Note  Patient: Phyllis [name]  Procedure(s) Performed: CESAREAN SECTION     Patient location during evaluation: Mother Baby Anesthesia Type: Spinal Level of consciousness: awake Pain management: satisfactory to patient Vital Signs Assessment: post-procedure vital signs reviewed and stable Respiratory status: spontaneous breathing Cardiovascular status: stable Anesthetic complications: no  No notable events documented.  Last Vitals:  Vitals:   01/28/23 0934 01/28/23 1410  BP: 114/69 115/67  Pulse: 75 80  Resp: 16 16  Temp: 36.7 C 36.8 C    Last Pain:  Vitals:   01/28/23 1410  TempSrc: Oral  PainSc:    Pain Goal:                   [employer]

## 2023-01-28 NOTE — Progress Notes (Signed)
Pt. declined EndoTool protocol, insulin drip and or insulin coverage. Pt explained that she already has her dexcom and insulin pump to manage her diabetes. The pt says she does not want to discontinue her insulin pump as she just placed a new pump. The patient states that she was told by her OBGYN not to remove her insulin pump.  Pt expressed that she would like to speak to Dr. Reina Fuse once she is available. Discussed pt disposition with Dr. Chestine Spore and charge RN. LR infusing per order. Will continue to monitor patient.

## 2023-01-28 NOTE — Progress Notes (Addendum)
BP 114/69   Pulse 75   Temp 98.1 F (36.7 C) (Oral)   Resp 16   Ht 5' (1.524 m)   Wt 63.3 kg   BMI 27.26 kg/m  Presented to patient bedside for evaluation, next cytotec dose vs pitocin currently due. Feeling regular, painful cramping but is asleep on arrival to room. Denies VB, LOF, endorses +FM  CE ftp/50/-2 TOCO q2-40min Cat 2 tracing - baseline 180, min var, recurrent late decels, occasional variable  Discussed with patient concern for cat 2 tracing very remote from delivery. Afebrile, concern for placental insufficiency in setting of uncontrolled T1DM. Poss9ibility that, while moving forward with medical induction, tracing may become more urgent requiring stat section. Not responding to changes, IV fluids, last BG 195. Pt wishes to speak with mother  ADDENDUM - pt agrees to [facility] for cat 2 tracing remote from delivery R/B/A of cesarean section discussed with patient. Alternative would be vaginal delivery which would mean shorter postpartum stay and decreased risk of bleeding. Risks of section include infection of the uterus, pelvic organs, or skin, inadvertent injury to internal organs, such as bowel or bladder. If there is major injury, extensive surgery may be required. If injury is minor, it may be treated with relative ease. Discussed possibility of excessive blood loss and transfusion. If bleeding cannot be controlled using medical or minor surgical methods, a cesarean hysterectomy may be performed which would mean no future fertility. Patient accepts the possibility of blood transfusion, if necessary. Patient understands and agrees to move forward with section.

## 2023-01-28 NOTE — Anesthesia Postprocedure Evaluation (Signed)
Anesthesia Post Note  Patient: Phyllis [name]  Procedure(s) Performed: CESAREAN SECTION     Patient location during evaluation: PACU Anesthesia Type: Spinal Level of consciousness: awake Pain management: pain level controlled Vital Signs Assessment: post-procedure vital signs reviewed and stable Respiratory status: spontaneous breathing, respiratory function stable and nonlabored ventilation Cardiovascular status: blood pressure returned to baseline and stable Postop Assessment: no headache, no backache and no apparent nausea or vomiting Anesthetic complications: no   No notable events documented.  Last Vitals:  Vitals:   01/28/23 1645 01/28/23 1700  BP: 103/66   Pulse: 93   Resp: (!) 22   Temp:  36.7 C  SpO2: 99%     Last Pain:  Vitals:   01/28/23 1700  TempSrc: Oral  PainSc:    Pain Goal:    LLE Motor Response: Purposeful movement (01/28/23 1645) LLE Sensation: Decreased (01/28/23 1645) RLE Motor Response: Purposeful movement (01/28/23 1645) RLE Sensation: Decreased (01/28/23 1645)     Epidural/Spinal Function Cutaneous sensation: No Sensation (01/28/23 1645), Patient able to flex knees: No (01/28/23 1645), Patient able to lift hips off bed: No (01/28/23 1645), Back pain beyond tenderness at insertion site: No (01/28/23 1645), Progressively worsening motor and/or sensory loss: No (01/28/23 1645), Bowel and/or bladder incontinence post epidural: No (01/28/23 1645)  Linton Rump

## 2023-01-28 NOTE — Op Note (Signed)
C-Section Operative Note  Date: 01/28/23  Preoperative Diagnosis: IUP @ 37 0/7, uncontrolled T1DM Postoperative Diagnosis: same as above Procedure: PLTCS Indications: cat 2 tracing remote from delivery Surgeon: Ellison Hughs, MD Findings: Viable female infant weighing 3080g with APGARS of 8 and 9 at 1 and 5 minutes, respectively. Normal appearing uterus, bilateral fallopian tubes and ovaries. Specimens: placenta to pathology EBL 492 IVF 1500 UOP 150  Patient Course: Patient admitted for IOL for uncontrolled T1DM on pump (omnipod 5). DM coordinator included in plan of care, postpartum settings have been pre-programmed. Received 2 doses of cytotec. However, after second dose, persistent cat 2 tracing noted with fetal tachycardia, minimal variability and recurrent lates. Urgent PLTCS called given ftp CE and no response to resuscitative measures. Endotool stopped during OR.  Consent:  R/B/A of cesarean section discussed with patient. Alternative would be vaginal delivery which would mean shorter postpartum stay and decreased risk of bleeding. Risks of section include infection of the uterus, pelvic organs, or skin, inadvertent injury to internal organs, such as bowel or bladder. If there is major injury, extensive surgery may be required. If injury is minor, it may be treated with relative ease. Discussed possibility of excessive blood loss and transfusion. If bleeding cannot be controlled using medical or minor surgical methods, a cesarean hysterectomy may be performed which would mean no future fertility. Patient accepts the possibility of blood transfusion, if necessary. Patient understands and agrees to move forward with section.   Operative Procedure: Patient was taken to the operating room where epidural anesthesia was found to be adequate by Allis clamp test. She was prepped and draped in the normal sterile fashion in the dorsal supine position with a leftward tilt. An appropriate time  out was performed. A Pfannenstiel skin incision was then made with the scalpel and carried through to the underlying layer of fascia by sharp dissection and Bovie cautery. The fascia was nicked in the midline and the incision was extended laterally with Mayo scissors. The superior aspect of the incision was grasped Kocher clamps and dissected off the underlying rectus muscles. In a similar fashion the inferior aspect was dissected off the rectus muscles. Rectus muscles were separated in the midline and the peritoneal cavity entered bluntly. The peritoneal incision was then extended both superiorly and inferiorly with careful attention to avoid both bowel and bladder. The Alexis self-retaining wound retractor was then placed within the incision and the lower uterine segment exposed. The bladder flap was developed with Metzenbaum scissors and pushed away from the lower uterine segment. The lower uterine segment was then incised in a low transverse fashion and the cavity itself entered bluntly. The incision was extended bluntly. Amniotic sac was ruptured and fluid was noted to be clear and copious in color. The infant's head was then lifted and delivered from the incision without difficulty using the standard movements. Loose nuchal x1, delivered through. The remainder of the infant delivered and the nose and mouth bulb suctioned with the cord clamped and cut as well. The infant was handed off to NICU. The placenta was then spontaneously expressed from the uterus and the uterus cleared of all clots and debris with moist lap sponge. The uterine incision was then repaired in 2 layers the first layer was a running locked layer of 0-vicryl and the second an imbricating layer of the same suture. The tubes and ovaries were inspected and the gutters cleared of all clots and debris. The uterine incision was inspected and found to be  hemostatic. All instruments and sponges as well as the Alexis retractor were then removed from  the abdomen. The peritoneum was then reapproximated using 3-0 vicryl suture. The fascia was then closed with 0 Vicryl in a running fashion.  The skin was closed with a subcuticular stitch of 4-0 Vicryl on a Keith needle and then reinforced with Dermabond and a Honeycomb. At the conclusion of the procedure all instruments and sponge counts were correct. Patient was taken to the recovery room in good condition with her baby accompanying her skin to skin.

## 2023-01-28 NOTE — Lactation Note (Signed)
This note was copied from a baby's chart. Lactation Consultation Note  Patient Name: Girl Jeshia Hamblin [id number] Date: 01/28/2023 Age:20 hours Reason for consult: Initial assessment;Primapara;1st time breastfeeding;Exclusive pumping and bottle feeding;Early term 37-38.6wks;Maternal endocrine disorder  P1- MOB reports that she will be exclusively pumping only and not placing infant to the breast. MOB also reports that she will be supplementing with formula as well. MOB asked LC to flange size her with her flange inserts. MOB is sizing at a 21mm flange. LC demonstrated how to attach the inserts into the 24 mm flange.   LC reviewed feeding infant on cue 8-12x in 24 hrs, not allowing infant to go over 3 hrs without a feeding, CDC milk storage guidelines and LC services handout. LC encouraged MOB to call for further assistance as needed.  Maternal Data Does the patient have breastfeeding experience prior to this delivery?: No  Feeding Mother's Current Feeding Choice: Breast Milk and Formula Nipple Type: Slow - flow  Lactation Tools Discussed/Used Pump Education: Milk Storage  Interventions Interventions: Breast feeding basics reviewed;Education;LC Services brochure  Discharge Discharge Education: Warning signs for feeding baby Pump: Hands Free;Personal  Consult Status Consult Status: Follow-up Date: 01/29/23 Follow-up type: In-patient    Dema Severin BS, IBCLC 01/28/2023, 6:06 PM

## 2023-01-28 NOTE — Inpatient Diabetes Management (Signed)
Inpatient Diabetes Program Recommendations  AACE/ADA: New Consensus Statement on Inpatient Glycemic Control (2015)  Target Ranges:  Prepandial:   less than 140 mg/dL      Peak postprandial:   less than 180 mg/dL (1-2 hours)      Critically ill patients:  140 - 180 mg/dL   Lab Results  Component Value Date   GLUCAP 166 (H) 01/28/2023   HGBA1C 10.1 (H) 01/28/2023    Review of Glycemic Control  Latest Reference Range & Units 01/28/23 02:33 01/28/23 04:48 01/28/23 05:20  Glucose-Capillary 70 - 99 mg/dL 161 (H) 53 (L) 096 (H)  (H): Data is abnormally high (L): Data is abnormally low  Diabetes history: T1DM  Outpatient Diabetes medications:  Ominpod with the Dexcom G6-manual mode using Humalog Basal-40.8 units/day Insulin Carb Ratio-1 unit for every 6 carbs Insulin Sensitivity Factor-1 units drops her 20 points Target Glucose-110 Endo-Dr. Criselda Peaches with [facility]  Inpatient Diabetes Program Recommendations:    Endo tool for labor.    Spoke with patient via phone as this DM coordinator is working remotely for weekend coverage.  She is currently wearing her Omnipod and Dexcom G6.  She is waiting to speak with her OB this morning before making a decision to turn off pump and start Endotool.  She was low this morning (53 mg/dL).    Prior to pregnancy, she was current with [facility] for endocrinology.  She saw Barron Alvine last on Jul 19, 2022 at which point she was transferred to Dr. Criselda Peaches due to positive pregnancy test.  Her pump settings on May 21 were as follows-  Insulin regimen:  Basal Rates 12AM 0.65  6am 0.95   12pm 0.90   8pm 1.05        21 units per day    Insulin to Carbohydrate Ratio 12AM 10  6am 8                     Insulin Sensitivity Factor 12AM 50   6am 40  9pm 45              Target Blood Glucose 12AM 150  6am 130  9pm 150              Back up insulin plan is Novolog 120/40/6 - Lantus 21 units    Fidelis states Dr. Criselda Peaches gave  her instructions for post-partum insulin pump setting.  She should change her basal profile to profile 1; change her insulin carb ratio to 11; change her insulin sensitivity factor to 40.  Lene does not know how to change her basal profile to 1.  This DM coordinator will instruct her and her mother today how to change the settings.  Reviewed pump and there is not a basal 1 profile; there is only a pregnancy profile.  She is in agreement to pause her Omnipod and start Endotool.  She does not want to remove her pod as she just placed a new pod last night along with a new Dexcom G6 sensor.  Explained the pump will need to be replaced Monday as it should be changed every 3 days.  She verbalizes understanding.  The longest she can pause her pump is 2 hrs.  The pump will not restart until they chose the option to "restart pump".  The pump will alert them every 2 hrs that the pump is paused.  Patient would like to continue pausing pump until delivery.  Mom was shown how to unlock her PDM incase  patient is sleeping or has had pain medicine.   Mom is aware how to continue to pause the pump.    Spoke with Dr. Vonna Kotyk.  Reprogrammed Omnipod for post delivery settings.    Basal-0.65/hr-total of 15.6 unit/day ICR-11 ISF-40 Target-120  Ahria and mom are aware once placenta is delivered and Endotool is discontinued, they need to restart the insulin pump.    Discussed hypoglycemia, signs, symptoms and treatments and increased risk while breastfeeding.  She should keep a snack close by and pay close attention to her CGM and BG readings.    Kara Mead, RN has been updated with plan as well.  She will check her Dexcom readings Q1hr and CBG Q4H while on Endotool.    Will continue to follow while inpatient.  Thank you, Dulce Sellar, MSN, CDCES Diabetes Coordinator Inpatient Diabetes Program [phone] (team pager from 8a-5p)

## 2023-01-28 NOTE — Anesthesia Preprocedure Evaluation (Signed)
Anesthesia Evaluation  Patient identified by MRN, date of birth, ID band Patient awake    Reviewed: Allergy & Precautions, NPO status , Patient's Chart, lab work & pertinent test results  History of Anesthesia Complications Negative for: history of anesthetic complications  Airway Mallampati: III  TM Distance: >3 FB Neck ROM: Full    Dental   Pulmonary neg pulmonary ROS   Pulmonary exam normal breath sounds clear to auscultation       Cardiovascular (-) hypertension Rhythm:Regular Rate:Normal  HLD   Neuro/Psych  Headaches, Seizures - (x1 at age 22 due to hypoglycemia),  PSYCHIATRIC DISORDERS  Depression       GI/Hepatic negative GI ROS, Neg liver ROS,,,  Endo/Other  diabetes (Hgb A1c >10), Poorly Controlled, Type 1, Insulin Dependent    Renal/GU negative Renal ROS     Musculoskeletal   Abdominal   Peds  Hematology negative hematology ROS (+) Lab Results      Component                Value               Date                      WBC                      8.1                 01/28/2023                HGB                      9.8 (L)             01/28/2023                HCT                      31.9 (L)            01/28/2023                MCV                      79.6 (L)            01/28/2023                PLT                      400                 01/28/2023              Anesthesia Other Findings   Reproductive/Obstetrics (+) Pregnancy                              Anesthesia Physical Anesthesia Plan  ASA: 3  Anesthesia Plan: Spinal   Post-op Pain Management:    Induction:   PONV Risk Score and Plan: 2 and Ondansetron, Dexamethasone and Treatment may vary due to age or medical condition  Airway Management Planned: Natural Airway  Additional Equipment:   Intra-op Plan:   Post-operative Plan:   Informed Consent: I have reviewed the patients History and Physical, chart,  labs and discussed the procedure including the risks, benefits and alternatives for  the proposed anesthesia with the patient or authorized representative who has indicated his/her understanding and acceptance.       Plan Discussed with: Anesthesiologist and CRNA  Anesthesia Plan Comments: (I have discussed risks of neuraxial anesthesia including but not limited to infection, bleeding, nerve injury, back pain, headache, seizures, and failure of block. Patient denies bleeding disorders and is not currently anticoagulated. Labs have been reviewed. Risks and benefits discussed. All patient's questions answered.  )         Anesthesia Quick Evaluation

## 2023-01-28 NOTE — Progress Notes (Signed)
Pt admitted to [facility]. Care order for pt to use her own CGM to manage blood glucose levels. Pt verbalized understanding and signed the Insulin Pump Patient Contract form.   Jeralyn Bennett RN

## 2023-01-29 LAB — CBC
HCT: 24.3 % — ABNORMAL LOW (ref 36.0–46.0)
Hemoglobin: 7.5 g/dL — ABNORMAL LOW (ref 12.0–15.0)
MCH: 25.2 pg — ABNORMAL LOW (ref 26.0–34.0)
MCHC: 30.9 g/dL (ref 30.0–36.0)
MCV: 81.5 fL (ref 80.0–100.0)
Platelets: 269 10*3/uL (ref 150–400)
RBC: 2.98 MIL/uL — ABNORMAL LOW (ref 3.87–5.11)
RDW: 13.6 % (ref 11.5–15.5)
WBC: 9.5 10*3/uL (ref 4.0–10.5)
nRBC: 0 % (ref 0.0–0.2)

## 2023-01-29 LAB — GLUCOSE, CAPILLARY
Glucose-Capillary: 131 mg/dL — ABNORMAL HIGH (ref 70–99)
Glucose-Capillary: 127 mg/dL — ABNORMAL HIGH (ref 70–99)
Glucose-Capillary: 296 mg/dL — ABNORMAL HIGH (ref 70–99)
Glucose-Capillary: 320 mg/dL — ABNORMAL HIGH (ref 70–99)
Glucose-Capillary: 198 mg/dL — ABNORMAL HIGH (ref 70–99)
Glucose-Capillary: 289 mg/dL — ABNORMAL HIGH (ref 70–99)

## 2023-01-29 MED ORDER — ALBUTEROL SULFATE (2.5 MG/3ML) 0.083% IN NEBU: 2.5000 mg | INHALATION_SOLUTION | Freq: Once | RESPIRATORY_TRACT | Status: DC | PRN

## 2023-01-29 MED ORDER — SODIUM CHLORIDE 0.9 % IV SOLN: INTRAVENOUS | Status: AC | PRN

## 2023-01-29 MED ORDER — SODIUM CHLORIDE 0.9 % IV BOLUS: 500.0000 mL | Freq: Once | INTRAVENOUS | Status: DC | PRN

## 2023-01-29 MED ORDER — IRON SUCROSE 20 MG/ML IV SOLN
500.0000 mg | INTRAVENOUS | Status: DC
  Administered 2023-01-29: 500 mg via INTRAVENOUS
  Filled 2023-01-29 (×2): qty 25

## 2023-01-29 MED ORDER — METHYLPREDNISOLONE SODIUM SUCC 125 MG IJ SOLR: 125.0000 mg | Freq: Once | INTRAMUSCULAR | Status: DC | PRN

## 2023-01-29 MED ORDER — EPINEPHRINE 0.3 MG/0.3ML IJ SOAJ: 0.3000 mg | Freq: Once | INTRAMUSCULAR | Status: DC | PRN

## 2023-01-29 MED ORDER — SODIUM CHLORIDE 0.9 % IV SOLN: 100.0000 mg | INTRAVENOUS | Status: DC

## 2023-01-29 MED ORDER — DIPHENHYDRAMINE HCL 50 MG/ML IJ SOLN: 25.0000 mg | Freq: Once | INTRAMUSCULAR | Status: DC | PRN

## 2023-01-29 NOTE — Social Work (Signed)
MOB was referred for history of depression/anxiety. ?* Referral screened out by Clinical Social Worker because none of the following criteria appear to apply: ?~ History of anxiety/depression during this pregnancy, or of post-partum depression following prior delivery. ?~ Diagnosis of anxiety and/or depression within last 3 years ?OR ?* MOB's symptoms currently being treated with medication and/or therapy. ? ?Please contact the Clinical Social Worker if needs arise, by MOB request, or if MOB scores greater than 9/yes to question 10 on Edinburgh Postpartum Depression Screen.  ? ?Yong Wahlquist, LCSW ?Clinical Social Worker ? ?

## 2023-01-29 NOTE — Inpatient Diabetes Management (Signed)
Inpatient Diabetes Program Recommendations  AACE/ADA: New Consensus Statement on Inpatient Glycemic Control (2015)  Target Ranges:  Prepandial:   less than 140 mg/dL      Peak postprandial:   less than 180 mg/dL (1-2 hours)      Critically ill patients:  140 - 180 mg/dL   Lab Results  Component Value Date   GLUCAP 127 (H) 01/29/2023   HGBA1C 10.1 (H) 01/28/2023    Review of Glycemic Control  Latest Reference Range & Units 01/28/23 21:39 01/29/23 07:13 01/29/23 08:10  Glucose-Capillary 70 - 99 mg/dL 782 (H) 956 (H) 213 (H)  (H): Data is abnormally high Diabetes history: T1DM   Outpatient Diabetes medications:  Ominpod with the Dexcom G6-manual mode using Humalog Basal-40.8 units/day Insulin Carb Ratio-1 unit for every 6 carbs Insulin Sensitivity Factor-1 units drops her 20 points Target Glucose-110 Endo-Dr. Criselda Peaches with [facility]   Inpatient Diabetes Program Recommendations:    Current glucose trends within target goals.  Noted discrepancy between CGM and fingerstick including AM reading of 49 mg/dL. Continue with fingersticks at this time.   Note: Diabetes Coordinator working to cover multiple campuses over weekend. Please page with any questions or needs.   Thanks, Lujean Rave, MSN, RNC-OB Diabetes Coordinator [phone] (8a-5p)

## 2023-01-29 NOTE — Progress Notes (Signed)
POSTPARTUM POSTOP PROGRESS NOTE  POD #1  Subjective:  N/V from overnight improved with short course of D5 IVF and Phenergan.  Pt denies problems with ambulating or po intake.  She denies nausea or vomiting this morning.  Pain is well controlled.  She has not had flatus but feels gas moving. She has not had bowel movement.  Lochia Minimal. Reviewed postop Hgb of 7.5, pt is amenable to IV iron replacement in order to try and avoid further constipation. States that her monitor is reading that her BG is 49 but fingerstick is in the 110s, has been happening since early this AM. Has brought pump with her but not new monitor, however her mother is visiting and can bring one. Advised DM coordinator will still be following  Objective: Blood pressure 95/60, pulse 75, temperature 97.7 F (36.5 C), temperature source Oral, resp. rate 14, height 5' (1.524 m), weight 63.3 kg, SpO2 100%, unknown if currently breastfeeding.  Physical Exam:  General: alert, cooperative and no distress Lochia:normal flow Chest: CTAB Heart: RRR no m/r/g Abdomen: +BS, soft, non-tender. Mild epigastric tympany Uterine Fundus: firm, 2cm below umbilicus. Honeycomb dressing intact, neg drainage Extremities: neg edema, neg calf TTP BL, neg Homans BL  Recent Labs    01/28/23 0043 01/29/23 0443  HGB 9.8* 7.5*  HCT 31.9* 24.3*    Assessment/Plan:  ASSESSMENT: Phyllis [name] is a 22 y.o. G1P1001 s/p PLTCS @ [redacted]w[redacted]d for cat 2 tracing remote from delivery. PNC c/b unc T1DM. Postop anemia   Lactation to follow given poor UOP for baby per pt T1DM - Omnipod in place, needs new monitor given discrepancy between monitor and FSBG. DM coordinator following. Has outpatient  Endo as well as Ophtho ([facility]). A1c this admission 10.1.  Postop anemia - 7.5hgb, Has baseline constipation likely 2/2 impaired glucose control. IV venofer ordered Q24hrs x2 Anticipate DC home POD#2-3 pending infant status and BG control (infant with known  small VSD, pp echo was recommended, peds following closely)  LOS: 1 day

## 2023-01-30 ENCOUNTER — Encounter (HOSPITAL_COMMUNITY): Payer: Self-pay | Admitting: Obstetrics and Gynecology

## 2023-01-30 LAB — GLUCOSE, CAPILLARY
Glucose-Capillary: 163 mg/dL — ABNORMAL HIGH (ref 70–99)
Glucose-Capillary: 169 mg/dL — ABNORMAL HIGH (ref 70–99)

## 2023-01-30 MED ORDER — POLYETHYLENE GLYCOL 3350 17 G PO PACK
17.0000 g | PACK | Freq: Every day | ORAL | Status: DC
  Filled 2023-01-30: qty 1

## 2023-01-30 MED ORDER — IBUPROFEN 100 MG/5ML PO SUSP: 600.0000 mg | Freq: Four times a day (QID) | ORAL | 0 refills | Status: AC | PRN

## 2023-01-30 MED ORDER — OXYCODONE HCL 5 MG PO TABS: 5.0000 mg | ORAL_TABLET | ORAL | 0 refills | Status: AC | PRN

## 2023-01-30 NOTE — Discharge Summary (Signed)
Postpartum Discharge Summary      Patient Name: Phyllis [name] DOB: [birth year] MRN: [id number]  Date of admission: 01/28/2023 Delivery date:01/28/2023 Delivering provider: Carlisle Cater Date of discharge: 01/30/2023  Admitting diagnosis: Type 1 diabetes mellitus affecting pregnancy in third trimester, antepartum [O24.013] Intrauterine pregnancy: [redacted]w[redacted]d     Secondary diagnosis:  Principal Problem:   Type 1 diabetes mellitus affecting pregnancy in third trimester, antepartum    Discharge diagnosis: Term Pregnancy Delivered and Type 1 diabetes, category 2 FHR remote from delivery                                                Hospital course: Induction of Labor With Cesarean Section   22 y.o. yo G1P1001 at [redacted]w[redacted]d was admitted to the hospital 01/28/2023 for induction of labor. Patient had a labor course significant for persistent Cat 2 FHR remote from delivery. The patient went for cesarean section due to  persistent Cat 2 FHR . Delivery details are as follows: Membrane Rupture Time/Date: 3:14 PM,01/28/2023  Delivery Method:C-Section, Low Transverse Details of operation can be found in separate operative Note.  Patient had a postpartum course complicated by elevated CBG and Hgb A1c of 10.1, diabetic coordinator involved.  Also with starting Hgb 9.8, down to 7.5 post-op, given IV iron. She is ambulating, tolerating a regular diet, passing flatus, and urinating well.  Patient is discharged home in stable condition on 01/30/23.      Newborn Data: Birth date:01/28/2023 Birth time:3:14 PM Gender:Female Living status:Living Apgars:8 ,9  Weight:3080 g                               Immunizations administered: Immunization History  Administered Date(s) Administered   Influenza Split 01/20/2012   Influenza,inj,Quad PF,6+ Mos 12/17/2012, 10/28/2013, 01/27/2015, 12/14/2015, 12/09/2019, 12/10/2020   Pneumococcal Polysaccharide-23 01/20/2012    Physical exam  Vitals:   01/29/23 1458  01/29/23 1559 01/29/23 2040 01/30/23 0514  BP: (!) 91/49 96/61 107/71 107/72  Pulse: 78 82 73 100  Resp: 16 17 17 18   Temp: 98.2 F (36.8 C) 98.5 F (36.9 C) 97.7 F (36.5 C) 98.1 F (36.7 C)  TempSrc: Oral Axillary Oral Oral  SpO2: 99% 99% 100% 100%  Weight:      Height:       General: alert Lochia: appropriate Uterine Fundus: firm Incision: Healing well with no significant drainage  Labs: Lab Results  Component Value Date   WBC 9.5 01/29/2023   HGB 7.5 (L) 01/29/2023   HCT 24.3 (L) 01/29/2023   MCV 81.5 01/29/2023   PLT 269 01/29/2023      Latest Ref Rng & Units 01/28/2023   12:43 AM  CMP  Glucose 70 - 99 mg/dL 70 - 99 mg/dL 725    366   BUN 6 - 20 mg/dL 6 - 20 mg/dL 13    13   Creatinine 4.40 - 1.00 mg/dL 3.47 - 4.25 mg/dL 9.56    3.87   Sodium 564 - 145 mmol/L 135 - 145 mmol/L 131    132   Potassium 3.5 - 5.1 mmol/L 3.5 - 5.1 mmol/L 3.8    4.2   Chloride 98 - 111 mmol/L 98 - 111 mmol/L 103    104   CO2 22 - 32 mmol/L 22 -  32 mmol/L 20    19   Calcium 8.9 - 10.3 mg/dL 8.9 - 16.1 mg/dL 9.5    9.7   Total Protein 6.5 - 8.1 g/dL 6.4   Total Bilirubin <0.9 mg/dL 0.7   Alkaline Phos 38 - 126 U/L 182   AST 15 - 41 U/L 20   ALT 0 - 44 U/L 13    Edinburgh Score:    01/29/2023    8:15 PM  Edinburgh Postnatal Depression Scale Screening Tool  I have been able to laugh and see the funny side of things. --      After visit meds:  Allergies as of 01/30/2023   No Known Allergies      Medication List     STOP taking these medications    famotidine 20 MG tablet Commonly known as: PEPCID   fluconazole 150 MG tablet Commonly known as: DIFLUCAN   ibuprofen 200 MG tablet Commonly known as: ADVIL Replaced by: ibuprofen 100 MG/5ML suspension   NovoLOG FlexPen 100 UNIT/ML FlexPen Generic drug: insulin aspart       TAKE these medications    Accu-Chek Guide test strip Generic drug: glucose blood Use one test strip as directed to monitor BG  up to 6x daily   Accu-Chek Guide w/Device Kit Use 1 kit as directed to monitor BG up to 6x daily   Accu-Chek Softclix Lancets lancets Use one lancet as directed to monitor BG up to 6x daily   accu-chek multiclix lancets USE AS DIRECTED TO TEST BLOOD GLUCOSE 6 TIMES DAILY   Baqsimi Two Pack 3 MG/DOSE Powd Generic drug: Glucagon Place 1 Units into the nose as needed.   BD Pen Needle Nano U/F 32G X 4 MM Misc Generic drug: Insulin Pen Needle Use up to 6 x per day for injections   calcium carbonate 500 MG chewable tablet Commonly known as: TUMS - dosed in mg elemental calcium Chew 1 tablet (200 mg of elemental calcium total) by mouth 2 (two) times daily as needed for indigestion or heartburn.   Dexcom G6 Sensor Misc Inject 1 applicator into the skin as directed. (change sensor every 10 days). Use to monitor glucose continuously.   Dexcom G6 Transmitter Misc Inject 1 Device into the skin as directed. (re-use up to 8x with each new sensor). Use to monitor glucose continuously.   glucagon 1 MG injection Inject 1 mg IM for severe hypoglycemia.   ibuprofen 100 MG/5ML suspension Commonly known as: ADVIL Take 30 mLs (600 mg total) by mouth every 6 (six) hours as needed for moderate pain (pain score 4-6). Replaces: ibuprofen 200 MG tablet   insulin lispro 100 UNIT/ML injection Commonly known as: HumaLOG INJECT 300 UNITS WITH INSULIN PUMP EVERY 48 HOURS.   Lantus SoloStar 100 UNIT/ML Solostar Pen Generic drug: insulin glargine Inject up to 50 units as instructed by provider (21 currently)   lidocaine-prilocaine cream Commonly known as: EMLA APPLY PRIOR TO PUMP SITE CHANGES AS DIRECTED   multivitamin-prenatal 27-0.8 MG Tabs tablet Take 1 tablet by mouth daily at 12 noon.   Omnipod 5 DexG7G6 Intro Gen 5 Kit Inject 1 kit into the skin as directed. . Change pod every 2 days. Intro kit comes with 2 boxes of pods, PDM device, pod pals, and user manual. Please fill for Omnipod 5 Into  kit NDC [address]-01   Omnipod 5 DexG7G6 Pods Gen 5 Misc Inject 1 Device into the skin as directed. Change pod every 2 days. Patient will need 3 boxes (each  contain 5 pods) for a 30 day supply. Please fill for [facility] [address]-21.   ondansetron 8 MG disintegrating tablet Commonly known as: ZOFRAN-ODT Take 1 tablet (8 mg total) by mouth every 8 (eight) hours as needed for nausea or vomiting.   oxyCODONE 5 MG immediate release tablet Commonly known as: Oxy IR/ROXICODONE Take 1 tablet (5 mg total) by mouth every 4 (four) hours as needed for severe pain (pain score 7-10).         Discharge home in stable condition Infant Feeding: Breast Infant Disposition:home with mother Discharge instruction: per After Visit Summary and Postpartum booklet. Activity: Advance as tolerated. Pelvic rest for 6 weeks.  Diet: carb modified diet Postpartum Appointment:2 weeks Future Appointments:No future appointments. Follow up Visit:  Follow-up Information     Shivaji, Valerie Roys, MD. Schedule an appointment as soon as possible for a visit in 2 week(s).   Specialty: Obstetrics and Gynecology Contact information: [address] [place] [address] [phone]                     01/30/2023 Zenaida Niece, MD

## 2023-01-30 NOTE — Discharge Instructions (Signed)
As per discharge pamphlet °

## 2023-01-30 NOTE — Progress Notes (Signed)
POD #2 LTCS  Doing ok, some pain but tolerable, not able to have BM yet, would like to go home today if possible Afeb, VSS Abd- soft, fundus firm, incision intact CBG 127-320  -Doing well from post-op standpoint, will d/c home this pm if baby ok to go -Type 1 DM, poor control, has appt with endocrinologist tomorrow, encouraged to keep this and get sugars better controlled, her omnipod is still inaccurate -chronic anemia, getting 2nd dose of IV iron today -will give Miralax today to try to help with constipation

## 2023-01-30 NOTE — Lactation Note (Addendum)
This note was copied from a baby's chart. Lactation Consultation Note  Patient Name: Phyllis [name] FIEPP'I Date: 01/30/2023 Age:20 hours Reason for consult: Exclusive pumping and bottle feeding;Mother's request  P1, 37 wks. Mom has questions on pumping. Encouraged mom that breast stimulation is the key to milk production. Pumping needs to be completed every 3 hrs or if baby is supplementing. Mom shares she tried pumping once- "too strong", discussed adaptable strength setting. Breast pumping initiated with mom in LC presence. Mom tolerated for a couple minutes, had uterine cramping and stopped to request pain medicine. Encouraged mom that she needed to pump every 3 hrs- 10-15 minutes as a goal- even a few minutes is better then no breast stimulation. Breast massage, hand expression to start, and warmth will help the pumping be more effective. Milk expected to "come in" by 5th day, discussed engorgement prevention and treatment with milk movement and ice packs for 10 minutes as needed to decrease inflammation/discomfort.  Maternal Data Has patient been taught Hand Expression?: Yes Does the patient have breastfeeding experience prior to this delivery?: No  Feeding Mother's Current Feeding Choice: Breast Milk and Formula   Lactation Tools Discussed/Used Tools: Pump Breast pump type: Other (comment) (Mom using her own wearable pump) Reason for Pumping: Mom desires to pump and feed breast milk Pumping frequency: Every 3 hours, or when baby eats formula  IDischarge Discharge Education: Engorgement and breast care Pump: Hands Free;Personal  Consult Status Consult Status: Follow-up Date: 01/31/23    Idamae Lusher 01/30/2023, 2:44 PM

## 2023-02-01 LAB — SURGICAL PATHOLOGY

## 2023-02-07 ENCOUNTER — Telehealth (HOSPITAL_COMMUNITY): Payer: Self-pay

## 2023-02-07 NOTE — Telephone Encounter (Signed)
02/07/2023 1456  Name: Phyllis [name] MRN: [id number] DOB: May 03, 2000  Reason for Call:  Transition of Care Hospital Discharge Call  Contact Status: Patient Contact Status: Message  Language assistant needed:          Follow-Up Questions:    Inocente Salles Postnatal Depression Scale:  In the Past 7 Days:    PHQ2-9 Depression Scale:     Discharge Follow-up:    Post-discharge interventions: NA  Signature  Signe Colt

## 2023-03-15 ENCOUNTER — Encounter (INDEPENDENT_AMBULATORY_CARE_PROVIDER_SITE_OTHER): Payer: Self-pay

## 2023-05-04 ENCOUNTER — Other Ambulatory Visit (INDEPENDENT_AMBULATORY_CARE_PROVIDER_SITE_OTHER): Payer: Self-pay | Admitting: Family

## 2023-05-04 DIAGNOSIS — E1065 Type 1 diabetes mellitus with hyperglycemia: Secondary | ICD-10-CM

## 2023-06-06 ENCOUNTER — Encounter (INDEPENDENT_AMBULATORY_CARE_PROVIDER_SITE_OTHER): Payer: Self-pay

## 2023-06-19 ENCOUNTER — Encounter (INDEPENDENT_AMBULATORY_CARE_PROVIDER_SITE_OTHER): Payer: Self-pay
